# Patient Record
Sex: Male | Born: 1945
Health system: Southern US, Community
[De-identification: ages and names within clinical notes are randomized; demographics above are authoritative.]

## PROBLEM LIST (undated history)

## (undated) DIAGNOSIS — I700 Atherosclerosis of aorta: Secondary | ICD-10-CM

## (undated) DIAGNOSIS — Z87891 Personal history of nicotine dependence: Secondary | ICD-10-CM

## (undated) DIAGNOSIS — R319 Hematuria, unspecified: Secondary | ICD-10-CM

## (undated) DIAGNOSIS — K5790 Diverticulosis of intestine, part unspecified, without perforation or abscess without bleeding: Secondary | ICD-10-CM

## (undated) DIAGNOSIS — M1990 Unspecified osteoarthritis, unspecified site: Secondary | ICD-10-CM

## (undated) DIAGNOSIS — R058 Other specified cough: Secondary | ICD-10-CM

## (undated) DIAGNOSIS — H269 Unspecified cataract: Secondary | ICD-10-CM

## (undated) DIAGNOSIS — R05 Cough: Secondary | ICD-10-CM

## (undated) DIAGNOSIS — I10 Essential (primary) hypertension: Secondary | ICD-10-CM

## (undated) DIAGNOSIS — R011 Cardiac murmur, unspecified: Secondary | ICD-10-CM

## (undated) DIAGNOSIS — R51 Headache: Secondary | ICD-10-CM

## (undated) DIAGNOSIS — S129XXA Fracture of neck, unspecified, initial encounter: Secondary | ICD-10-CM

## (undated) DIAGNOSIS — F509 Eating disorder, unspecified: Secondary | ICD-10-CM

## (undated) DIAGNOSIS — E785 Hyperlipidemia, unspecified: Secondary | ICD-10-CM

## (undated) DIAGNOSIS — D126 Benign neoplasm of colon, unspecified: Secondary | ICD-10-CM

## (undated) DIAGNOSIS — K760 Fatty (change of) liver, not elsewhere classified: Secondary | ICD-10-CM

## (undated) HISTORY — PX: OTHER SURGICAL HISTORY: SHX169

## (undated) HISTORY — DX: Eating disorder, unspecified: F50.9

## (undated) HISTORY — PX: COLONOSCOPY: SHX174

## (undated) HISTORY — DX: Essential (primary) hypertension: I10

## (undated) HISTORY — DX: Unspecified osteoarthritis, unspecified site: M19.90

## (undated) HISTORY — DX: Cardiac murmur, unspecified: R01.1

## (undated) HISTORY — DX: Personal history of nicotine dependence: Z87.891

## (undated) HISTORY — DX: Headache: R51

## (undated) HISTORY — DX: Fatty (change of) liver, not elsewhere classified: K76.0

## (undated) HISTORY — DX: Fracture of neck, unspecified, initial encounter: S12.9XXA

## (undated) HISTORY — DX: Other specified cough: R05.8

## (undated) HISTORY — DX: Hyperlipidemia, unspecified: E78.5

## (undated) HISTORY — DX: Atherosclerosis of aorta: I70.0

## (undated) HISTORY — DX: Hematuria, unspecified: R31.9

## (undated) HISTORY — DX: Diverticulosis of intestine, part unspecified, without perforation or abscess without bleeding: K57.90

## (undated) HISTORY — DX: Cough: R05

## (undated) HISTORY — DX: Unspecified cataract: H26.9

## (undated) HISTORY — DX: Benign neoplasm of colon, unspecified: D12.6

---

## 2000-06-21 ENCOUNTER — Emergency Department (HOSPITAL_COMMUNITY): Admission: EM | Admit: 2000-06-21 | Discharge: 2000-06-21 | Payer: Self-pay | Admitting: Emergency Medicine

## 2000-06-21 ENCOUNTER — Encounter: Payer: Self-pay | Admitting: Emergency Medicine

## 2002-05-26 ENCOUNTER — Encounter: Admission: RE | Admit: 2002-05-26 | Discharge: 2002-05-26 | Payer: Self-pay | Admitting: Family Medicine

## 2002-05-26 ENCOUNTER — Encounter: Payer: Self-pay | Admitting: Family Medicine

## 2011-12-24 LAB — HM DIABETES EYE EXAM

## 2012-05-29 ENCOUNTER — Encounter: Payer: Self-pay | Admitting: Internal Medicine

## 2012-05-29 ENCOUNTER — Ambulatory Visit (INDEPENDENT_AMBULATORY_CARE_PROVIDER_SITE_OTHER): Payer: BC Managed Care – PPO | Admitting: Internal Medicine

## 2012-05-29 VITALS — BP 140/90 | HR 90 | Temp 98.6°F | Ht 71.5 in | Wt 160.8 lb

## 2012-05-29 DIAGNOSIS — Z720 Tobacco use: Secondary | ICD-10-CM

## 2012-05-29 DIAGNOSIS — F17200 Nicotine dependence, unspecified, uncomplicated: Secondary | ICD-10-CM

## 2012-05-29 DIAGNOSIS — Z1211 Encounter for screening for malignant neoplasm of colon: Secondary | ICD-10-CM

## 2012-05-29 DIAGNOSIS — E119 Type 2 diabetes mellitus without complications: Secondary | ICD-10-CM

## 2012-05-29 DIAGNOSIS — M778 Other enthesopathies, not elsewhere classified: Secondary | ICD-10-CM

## 2012-05-29 DIAGNOSIS — E785 Hyperlipidemia, unspecified: Secondary | ICD-10-CM

## 2012-05-29 DIAGNOSIS — I10 Essential (primary) hypertension: Secondary | ICD-10-CM | POA: Insufficient documentation

## 2012-05-29 MED ORDER — ATORVASTATIN CALCIUM 20 MG PO TABS: 20.0000 mg | ORAL_TABLET | Freq: Every day | ORAL | Status: DC

## 2012-05-29 MED ORDER — AMLODIPINE BESYLATE 5 MG PO TABS: 5.0000 mg | ORAL_TABLET | Freq: Every day | ORAL | Status: DC

## 2012-05-29 MED ORDER — LISINOPRIL-HYDROCHLOROTHIAZIDE 20-25 MG PO TABS: 1.0000 | ORAL_TABLET | Freq: Every day | ORAL | Status: DC

## 2012-05-29 NOTE — Assessment & Plan Note (Signed)
Blood pressure slightly above goal today. Will continue to monitor. We'll continue current medications. Recent renal function from March 2013 was normal. We'll plan to repeat in one month. Patient will followup in one month for recheck. If blood pressure consistently elevated, would favor increasing Norvasc to 10 mg daily.

## 2012-05-29 NOTE — Patient Instructions (Signed)
Please have labs checked at [facility] in 1 month.

## 2012-05-29 NOTE — Assessment & Plan Note (Signed)
Encouraged smoking cessation 

## 2012-05-29 NOTE — Assessment & Plan Note (Signed)
Patient with history of borderline diabetes. Last hemoglobin A1c was 6% performed at outside facility. He does not regularly check blood sugars. Will recheck hemoglobin A1c with labs in 1 month. Encourage continued efforts at healthy diet and regular physical activity.

## 2012-05-29 NOTE — Assessment & Plan Note (Signed)
Patient with left arm pain at the insertion site of the triceps. Suspect this is related to overuse with his job. Alternative consideration would be atypical angina, however no other symptoms to suggest this and EKG is normal today. We'll continue to monitor. Encouraged limiting to use as much is possible. Will use nonsteroidals and ice as needed. If symptoms are persistent, may need referral to sports medicine.

## 2012-05-29 NOTE — Assessment & Plan Note (Signed)
Patient has never had colon cancer screening. His sister had colon cancer and is deceased. Will set up colonoscopy.

## 2012-05-29 NOTE — Progress Notes (Signed)
Subjective:    Patient ID: Michael Roy, male    DOB: 1946/12/04, 66 y.o.   MRN: [id number]  HPI 66 year old male with history of prediabetes, hypertension, hyperlipidemia, and tobacco abuse presents to establish care. He reports that he is generally feeling well. In regards to prediabetes, this has been diet controlled. Labwork from March 2013 shows hemoglobin A1c of 6%. He does not regularly check his blood sugar.  In regards to history of hypertension, he reports full compliance with his medications. He denies any chest pain, palpitations, headache. He does not regularly check his blood pressure.  In regards to hyperlipidemia, recent labs show LDL greater than 100. He has never been on a statin medication. He has tried to limit intake of saturated fats and increase fiber with some improvement in his lipids. However, he does note some dietary indiscretion.  He notes that he has never had colon cancer screening. His sister died of colon cancer. In the past, he has refused colonoscopy. On occasion, he has noted dark stools, but none recently.  He also notes some intermittent discomfort in his left upper arm. 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 He has not taken any medication for this. He denies chest pain, shortness of breath, diaphoresis, nausea.  Outpatient Encounter Prescriptions as of 05/29/2012  Medication Sig Dispense Refill  . amLODipine (NORVASC) 5 MG tablet Take 1 tablet (5 mg total) by mouth daily.  90 tablet  4  . Aspirin-Salicylamide-Caffeine (BC HEADACHE POWDER PO) Take 1 packet by mouth as needed.      Marland Kitchen atorvastatin (LIPITOR) 20 MG tablet Take 1 tablet (20 mg total) by mouth daily.  30 tablet  3  . Cholecalciferol (VITAMIN D3) 400 UNITS CAPS Take 1 capsule by mouth daily.      Marland Kitchen lisinopril-hydrochlorothiazide (PRINZIDE,ZESTORETIC) 20-25 MG per tablet Take 1 tablet by mouth daily.  90 tablet  4  . meloxicam  (MOBIC) 15 MG tablet Take 15 mg by mouth daily.        Review of Systems  Constitutional: Negative for fever, chills, activity change, appetite change, fatigue and unexpected weight change.  Eyes: Negative for visual disturbance.  Respiratory: Negative for cough and shortness of breath.   Cardiovascular: Negative for chest pain, palpitations and leg swelling.  Gastrointestinal: Negative for abdominal pain and abdominal distention.  Genitourinary: Negative for dysuria, urgency and difficulty urinating.  Musculoskeletal: Positive for myalgias. Negative for arthralgias and gait problem.  Skin: Negative for color change and rash.  Hematological: Negative for adenopathy.  Psychiatric/Behavioral: Negative for disturbed wake/sleep cycle and dysphoric mood. The patient is not nervous/anxious.    BP 140/90  Pulse 90  Temp 98.6 F (37 C) (Oral)  Ht 5' 11.5" (1.816 m)  Wt 160 lb 12 oz (72.916 kg)  BMI 22.11 kg/m2  SpO2 97%     Objective:   Physical Exam  Constitutional: He is oriented to person, place, and time. He appears well-developed and well-nourished. No distress.  HENT:  Head: Normocephalic and atraumatic.  Right Ear: External ear normal.  Left Ear: External ear normal.  Nose: Nose normal.  Mouth/Throat: Oropharynx is clear and moist. No oropharyngeal exudate.  Eyes: Conjunctivae and EOM are normal. Pupils are equal, round, and reactive to light. Right eye exhibits no discharge. Left eye exhibits no discharge. No scleral icterus.  Neck: Normal range of motion. Neck supple. No tracheal deviation present. No thyromegaly present.  Cardiovascular: Normal rate,  regular rhythm and normal heart sounds.  Exam reveals no gallop and no friction rub.   No murmur heard. Pulmonary/Chest: Effort normal and breath sounds normal. No respiratory distress. He has no wheezes. He has no rales. He exhibits no tenderness.  Abdominal: Soft. Bowel sounds are normal. He exhibits no distension and no mass.  There is no tenderness.  Musculoskeletal: Normal range of motion. He exhibits no edema.  Lymphadenopathy:    He has no cervical adenopathy.  Neurological: He is alert and oriented to person, place, and time. No cranial nerve deficit. Coordination normal.  Skin: Skin is warm and dry. No rash noted. He is not diaphoretic. No erythema. No pallor.  Psychiatric: He has a normal mood and affect. His behavior is normal. Judgment and thought content normal.          Assessment & Plan:

## 2012-05-29 NOTE — Assessment & Plan Note (Signed)
Recent LDL on labs performed at outside facility was greater than 100. Discussed goal of less than 70 in the setting of prediabetes. Will start atorvastatin 20 mg daily. We'll recheck LFTs and lipids in one month.

## 2012-07-03 ENCOUNTER — Encounter: Payer: Self-pay | Admitting: Internal Medicine

## 2012-07-03 ENCOUNTER — Ambulatory Visit (INDEPENDENT_AMBULATORY_CARE_PROVIDER_SITE_OTHER): Payer: BC Managed Care – PPO | Admitting: Internal Medicine

## 2012-07-03 VITALS — BP 120/82 | HR 63 | Temp 98.4°F | Ht 71.5 in | Wt 162.0 lb

## 2012-07-03 DIAGNOSIS — I10 Essential (primary) hypertension: Secondary | ICD-10-CM

## 2012-07-03 DIAGNOSIS — E119 Type 2 diabetes mellitus without complications: Secondary | ICD-10-CM

## 2012-07-03 DIAGNOSIS — E785 Hyperlipidemia, unspecified: Secondary | ICD-10-CM

## 2012-07-03 MED ORDER — ATORVASTATIN CALCIUM 20 MG PO TABS: 20.0000 mg | ORAL_TABLET | Freq: Every day | ORAL | Status: DC

## 2012-07-03 NOTE — Progress Notes (Signed)
Subjective:    Patient ID: Michael Roy, male    DOB: [birth year], 66 y.o.   MRN: [id number]  HPI 66 year old male with history of hypertension, hyperlipidemia, and borderline diabetes presents for followup. At his last visit, we resumed Lipitor for treatment of hyperlipidemia. Repeat lipids show marked decrease in total cholesterol by 100 points. LDL is now less than 100. He has not made any changes in his diet. He reports that he has had no symptoms of myalgia with use of atorvastatin.  In regards to hypertension, he has not been checking his blood pressure regularly however reports full compliance with medication.  In regards to borderline diabetes, he also does not regularly check his blood sugar. Recent fasting blood sugar was 132.  He continues to have some pain and occasional numbness in his lateral left forearm and hand. However, he reports this is recently exacerbated by increased physical activity at work. He has not wish to pursue further testing or imaging at this point.  Outpatient Encounter Prescriptions as of 07/03/2012  Medication Sig Dispense Refill  . amLODipine (NORVASC) 5 MG tablet Take 1 tablet (5 mg total) by mouth daily.  90 tablet  4  . Aspirin-Salicylamide-Caffeine (BC HEADACHE POWDER PO) Take 1 packet by mouth as needed.      Marland Kitchen atorvastatin (LIPITOR) 20 MG tablet Take 1 tablet (20 mg total) by mouth daily.  30 tablet  6  . Cholecalciferol (VITAMIN D3) 400 UNITS CAPS Take 1 capsule by mouth daily.      Marland Kitchen lisinopril-hydrochlorothiazide (PRINZIDE,ZESTORETIC) 20-25 MG per tablet Take 1 tablet by mouth daily.  90 tablet  4  . meloxicam (MOBIC) 15 MG tablet Take 15 mg by mouth daily.      Marland Kitchen DISCONTD: atorvastatin (LIPITOR) 20 MG tablet Take 1 tablet (20 mg total) by mouth daily.  30 tablet  3    Review of Systems  Constitutional: Negative for fever, chills, activity change, appetite change, fatigue and unexpected weight change.  Eyes: Negative for visual disturbance.    Respiratory: Negative for cough and shortness of breath.   Cardiovascular: Negative for chest pain, palpitations and leg swelling.  Gastrointestinal: Negative for abdominal pain and abdominal distention.  Genitourinary: Negative for dysuria, urgency and difficulty urinating.  Musculoskeletal: Negative for arthralgias and gait problem.  Skin: Negative for color change and rash.  Neurological: Positive for numbness.  Hematological: Negative for adenopathy.  Psychiatric/Behavioral: Negative for disturbed wake/sleep cycle and dysphoric mood. The patient is not nervous/anxious.    BP 120/82  Pulse 63  Temp 98.4 F (36.9 C) (Oral)  Ht 5' 11.5" (1.816 m)  Wt 162 lb (73.483 kg)  BMI 22.28 kg/m2  SpO2 96%     Objective:   Physical Exam  Constitutional: He is oriented to person, place, and time. He appears well-developed and well-nourished. No distress.  HENT:  Head: Normocephalic and atraumatic.  Right Ear: External ear normal.  Left Ear: External ear normal.  Nose: Nose normal.  Mouth/Throat: Oropharynx is clear and moist. No oropharyngeal exudate.  Eyes: Conjunctivae and EOM are normal. Pupils are equal, round, and reactive to light. Right eye exhibits no discharge. Left eye exhibits no discharge. No scleral icterus.  Neck: Normal range of motion. Neck supple. No tracheal deviation present. No thyromegaly present.  Cardiovascular: Normal rate, regular rhythm and normal heart sounds.  Exam reveals no gallop and no friction rub.   No murmur heard. Pulmonary/Chest: Effort normal and breath sounds normal. No respiratory distress. He has no wheezes.  He has no rales. He exhibits no tenderness.  Musculoskeletal: Normal range of motion. He exhibits no edema.  Lymphadenopathy:    He has no cervical adenopathy.  Neurological: He is alert and oriented to person, place, and time. No cranial nerve deficit. Coordination normal.  Skin: Skin is warm and dry. No rash noted. He is not diaphoretic. No  erythema. No pallor.  Psychiatric: He has a normal mood and affect. His behavior is normal. Judgment and thought content normal.          Assessment & Plan:

## 2012-07-03 NOTE — Assessment & Plan Note (Signed)
Recent fasting blood sugar was 132. A1c was not checked with labs. Wrote orders to have A1c checked with labs at work. He is on an ACE inhibitor and statin. Followup in 3 months.

## 2012-07-03 NOTE — Assessment & Plan Note (Signed)
Blood pressure well-controlled today. We'll continue current medications. Recent renal function from July 2013 was normal. Followup in 3 months.

## 2012-07-03 NOTE — Assessment & Plan Note (Signed)
Recent LDL was improved at less than 100. Given borderline diabetes call or be less than 70. Encouraged improved compliance with diet with increased intake of fiber and decrease saturated fat. Will continue atorvastatin 20 mg daily and plan to repeat lipids in 3 months.

## 2012-07-04 HISTORY — PX: POLYPECTOMY: SHX149

## 2012-07-09 ENCOUNTER — Ambulatory Visit (AMBULATORY_SURGERY_CENTER): Payer: BC Managed Care – PPO

## 2012-07-09 ENCOUNTER — Encounter: Payer: Self-pay | Admitting: Internal Medicine

## 2012-07-09 VITALS — Ht 71.5 in | Wt 160.0 lb

## 2012-07-09 DIAGNOSIS — Z1211 Encounter for screening for malignant neoplasm of colon: Secondary | ICD-10-CM

## 2012-07-09 MED ORDER — MOVIPREP 100 G PO SOLR: 1.0000 | Freq: Once | ORAL | Status: DC

## 2012-07-10 ENCOUNTER — Telehealth: Payer: Self-pay | Admitting: Internal Medicine

## 2012-07-10 NOTE — Telephone Encounter (Signed)
Called patient at home number x 2, someone picks up the phone and hangs up.  Will call back later.

## 2012-07-10 NOTE — Telephone Encounter (Signed)
Labs show A1c of 6.1%. This is consistent with pre-diabetes. I would recommend decreased intake of processed carbohydrates.

## 2012-07-11 NOTE — Telephone Encounter (Signed)
Patient advised as instructed via telephone. 

## 2012-07-15 LAB — HM COLONOSCOPY: HM Colonoscopy: 18

## 2012-07-23 ENCOUNTER — Encounter: Payer: Self-pay | Admitting: Internal Medicine

## 2012-07-23 ENCOUNTER — Ambulatory Visit (AMBULATORY_SURGERY_CENTER): Payer: BC Managed Care – PPO | Admitting: Internal Medicine

## 2012-07-23 VITALS — BP 147/96 | HR 88 | Temp 96.4°F | Resp 18 | Ht 71.5 in | Wt 160.0 lb

## 2012-07-23 DIAGNOSIS — K5289 Other specified noninfective gastroenteritis and colitis: Secondary | ICD-10-CM

## 2012-07-23 DIAGNOSIS — Z800 Family history of malignant neoplasm of digestive organs: Secondary | ICD-10-CM

## 2012-07-23 DIAGNOSIS — K529 Noninfective gastroenteritis and colitis, unspecified: Secondary | ICD-10-CM

## 2012-07-23 DIAGNOSIS — Z8371 Family history of colonic polyps: Secondary | ICD-10-CM

## 2012-07-23 DIAGNOSIS — D126 Benign neoplasm of colon, unspecified: Secondary | ICD-10-CM

## 2012-07-23 DIAGNOSIS — Z1211 Encounter for screening for malignant neoplasm of colon: Secondary | ICD-10-CM

## 2012-07-23 DIAGNOSIS — K635 Polyp of colon: Secondary | ICD-10-CM

## 2012-07-23 MED ORDER — SODIUM CHLORIDE 0.9 % IV SOLN: 500.0000 mL | INTRAVENOUS | Status: DC

## 2012-07-23 NOTE — Op Note (Signed)
[facility] [address] [place], [address]   COLONOSCOPY PROCEDURE REPORT  PATIENT: Prentiss, Polio  MR#: [id number] BIRTHDATE: 28-Jan-1946 , 66  yrs. old GENDER: Male ENDOSCOPIST: Beverley Fiedler, MD REFERRED [id number] Dan Humphreys, M.D. PROCEDURE DATE:  07/23/2012 PROCEDURE:   Colonoscopy with biopsy, Colonoscopy with snare polypectomy, and Colonoscopy with cold biopsy polypectomy ASA CLASS:   Class II INDICATIONS:elevated risk screening (sister with colon cancer and brother with colon polyps) and first colonoscopy. MEDICATIONS: propofol (Diprivan)  460 mg IV  DESCRIPTION OF PROCEDURE:   After the risks benefits and alternatives of the procedure were thoroughly explained, informed consent was obtained.       The LB [id number]  endoscope was introduced through the anus and advanced to the   . No adverse events experienced.   The quality of the prep was good, using MoviPrep  The instrument was then slowly withdrawn as the colon was fully examined.      COLON FINDINGS: Two sessile polyps measuring 3-6 mm in size were found at the cecum.  A polypectomy was performed with a cold snare. The resection was complete and the polyp tissue was completely retrieved.  A polypectomy was performed using snare cautery.  The resection was complete and the polyp tissue was completely retrieved.  Three polyps were identified and removed from the ascending colon, measuring 2 - 6 mm.  The polyps were removed with cold snare (2) and cold forceps (1).  Retrieval complete.  2 pedunculated polyps 5 - 8 mm were removed from the transverse colon with hot snare and retrieved.  3 polyps were removed from descending colon with cold snare and retrieved (4 - 6mm).  1 sessile 6 mm polyp was removed from sigmoid colon with hot snare and retrieved.  There was erythema scattered throughout the transverse and left colon and cold biopsies were taken and sent to pathology.  2 pedunculated  polyps measuring 15 - 20 mm were removed with hot snare from the sigmoid colon and retrieved.  5 additional polyps were removed from the rectosigmoid colon with hot and cold snare and retrieved.  These measured 3 - 8 mm.  Diverticulosis, mild, in the left colon.  Retroflexed views revealed internal hemorrhoid.  The time to cecum =     Withdrawal time=    and the procedure completed. COMPLICATIONS: There were no complications. ENDOSCOPIC IMPRESSION: 1. 18 polyps removed from cecum to rectum measuring 2 - 20 mm with cold forceps, cold and hot snare. and sent to pathology. 2. Diverticulosis left colon. 3. Internal hemorrhoids. 4) Scattered and nonspecific erythema, possible prep artifact. Multiple biopsies obtained.  RECOMMENDATIONS: 1.  Hold aspirin, aspirin products, and anti-inflammatory medication for 2 weeks. 2.  Await pathology results 3.  High fiber diet. 4.  Repeat Colonoscopy in 1 year. 5.  You will receive a letter within 1-2 weeks with the results of your biopsy as well as final recommendations.  Please call my office if you have not received a letter after 3 weeks.  eSigned:  Beverley Fiedler, MD 07/23/2012 10:45 AM   OZ:[id number], Victorino Dike MD The Patient   PATIENT NAME:  Abb, Gobert MR#: [id number]

## 2012-07-23 NOTE — Patient Instructions (Addendum)
Discharge instructions given with verbal understanding. Handouts on polyps, diverticulosis and hemorrhoids given. Resume previous medications. Hold aspirin and aspirin products for two weeks. YOU HAD AN ENDOSCOPIC PROCEDURE TODAY AT THE [facility]: Refer to the procedure report that was given to you for any specific questions about what was found during the examination.  If the procedure report does not answer your questions, please call your gastroenterologist to clarify.  If you requested that your care partner not be given the details of your procedure findings, then the procedure report has been included in a sealed envelope for you to review at your convenience later.  YOU SHOULD EXPECT: Some feelings of bloating in the abdomen. Passage of more gas than usual.  Walking can help get rid of the air that was put into your GI tract during the procedure and reduce the bloating. If you had a lower endoscopy (such as a colonoscopy or flexible sigmoidoscopy) you may notice spotting of blood in your stool or on the toilet paper. If you underwent a bowel prep for your procedure, then you may not have a normal bowel movement for a few days.  DIET: Your first meal following the procedure should be a light meal and then it is ok to progress to your normal diet.  A half-sandwich or bowl of soup is an example of a good first meal.  Heavy or fried foods are harder to digest and may make you feel nauseous or bloated.  Likewise meals heavy in dairy and vegetables can cause extra gas to form and this can also increase the bloating.  Drink plenty of fluids but you should avoid alcoholic beverages for 24 hours.  ACTIVITY: Your care partner should take you home directly after the procedure.  You should plan to take it easy, moving slowly for the rest of the day.  You can resume normal activity the day after the procedure however you should NOT DRIVE or use heavy machinery for 24 hours (because of the sedation  medicines used during the test).    SYMPTOMS TO REPORT IMMEDIATELY: A gastroenterologist can be reached at any hour.  During normal business hours, 8:30 AM to 5:00 PM Monday through Friday, call [phone].  After hours and on weekends, please call the GI answering service at [phone] who will take a message and have the physician on call contact you.   Following lower endoscopy (colonoscopy or flexible sigmoidoscopy):  Excessive amounts of blood in the stool  Significant tenderness or worsening of abdominal pains  Swelling of the abdomen that is new, acute  Fever of 100F or higher FOLLOW UP: If any biopsies were taken you will be contacted by phone or by letter within the next 1-3 weeks.  Call your gastroenterologist if you have not heard about the biopsies in 3 weeks.  Our staff will call the home number listed on your records the next business day following your procedure to check on you and address any questions or concerns that you may have at that time regarding the information given to you following your procedure. This is a courtesy call and so if there is no answer at the home number and we have not heard from you through the emergency physician on call, we will assume that you have returned to your regular daily activities without incident.  SIGNATURES/CONFIDENTIALITY: You and/or your care partner have signed paperwork which will be entered into your electronic medical record.  These signatures attest to the fact that  that the information above on your After Visit Summary has been reviewed and is understood.  Full responsibility of the confidentiality of this discharge information lies with you and/or your care-partner.

## 2012-07-23 NOTE — Progress Notes (Signed)
Patient did not experience any of the following events: a burn prior to discharge; a fall within the facility; wrong site/side/patient/procedure/implant event; or a hospital transfer or hospital admission upon discharge from the facility. (G8907) Patient did not have preoperative order for IV antibiotic SSI prophylaxis. (G8918)  

## 2012-07-24 ENCOUNTER — Telehealth: Payer: Self-pay | Admitting: *Deleted

## 2012-07-24 NOTE — Telephone Encounter (Signed)
  Follow up Call-  Call back number 07/23/2012  Post procedure Call Back phone  # [phone]  Permission to leave phone message Yes     Patient questions:  Do you have a fever, pain , or abdominal swelling? no Pain Score  0 *  Have you tolerated food without any problems? yes  Have you been able to return to your normal activities? yes  Do you have any questions about your discharge instructions: Diet   no Medications  no Follow up visit  no  Do you have questions or concerns about your Care? no  Actions: * If pain score is 4 or above: No action needed, pain <4.

## 2012-07-29 ENCOUNTER — Encounter: Payer: Self-pay | Admitting: Internal Medicine

## 2012-10-09 ENCOUNTER — Encounter: Payer: Self-pay | Admitting: Internal Medicine

## 2012-10-09 ENCOUNTER — Ambulatory Visit (INDEPENDENT_AMBULATORY_CARE_PROVIDER_SITE_OTHER): Payer: BC Managed Care – PPO | Admitting: Internal Medicine

## 2012-10-09 VITALS — BP 122/72 | HR 80 | Temp 98.8°F | Ht 71.5 in | Wt 163.5 lb

## 2012-10-09 DIAGNOSIS — R200 Anesthesia of skin: Secondary | ICD-10-CM | POA: Insufficient documentation

## 2012-10-09 DIAGNOSIS — R209 Unspecified disturbances of skin sensation: Secondary | ICD-10-CM

## 2012-10-09 DIAGNOSIS — E119 Type 2 diabetes mellitus without complications: Secondary | ICD-10-CM

## 2012-10-09 DIAGNOSIS — I10 Essential (primary) hypertension: Secondary | ICD-10-CM

## 2012-10-09 NOTE — Patient Instructions (Signed)
Please check CMP, lipids, A1c with labs prior to next visit (Labs done at [facility]).

## 2012-10-09 NOTE — Progress Notes (Signed)
Subjective:    Patient ID: Michael Roy, male    DOB: 1946-04-18, 66 y.o.   MRN: [id number]  HPI 66 year old male with history of hypertension, borderline diabetes, hyperlipidemia presents for followup. He reports he is generally feeling well. He reports full compliance with all his medications. He did not have repeat lab work checked at his employer prior to this visit. Last hemoglobin A1c was 6.1%. Next  He is concerned today about several month history of weakness in his left hand and numbness in his left hand. He reports that in the distant past he had a fracture of the cervical spine, a hangman's fracture. Since that time he has had intermittent episodes of numbness and weakness in his left hand. He reports he occasionally drops things from his hands. He is not currently taking any medication for the symptoms.  Outpatient Encounter Prescriptions as of 10/09/2012  Medication Sig Dispense Refill  . amLODipine (NORVASC) 5 MG tablet Take 1 tablet (5 mg total) by mouth daily.  90 tablet  4  . Aspirin-Salicylamide-Caffeine (BC HEADACHE POWDER PO) Take 1 packet by mouth as needed.      Marland Kitchen atorvastatin (LIPITOR) 20 MG tablet Take 1 tablet (20 mg total) by mouth daily.  30 tablet  6  . Cholecalciferol (VITAMIN D3) 400 UNITS CAPS Take 1 capsule by mouth daily.      Marland Kitchen lisinopril-hydrochlorothiazide (PRINZIDE,ZESTORETIC) 20-25 MG per tablet Take 1 tablet by mouth daily.  90 tablet  4  . meloxicam (MOBIC) 15 MG tablet Take 15 mg by mouth daily.       BP 122/72  Pulse 80  Temp 98.8 F (37.1 C) (Oral)  Ht 5' 11.5" (1.816 m)  Wt 163 lb 8 oz (74.163 kg)  BMI 22.49 kg/m2  SpO2 96%  Review of Systems  Constitutional: Negative for fever, chills, activity change, appetite change, fatigue and unexpected weight change.  Eyes: Negative for visual disturbance.  Respiratory: Negative for cough and shortness of breath.   Cardiovascular: Negative for chest pain, palpitations and leg swelling.    Gastrointestinal: Negative for abdominal pain and abdominal distention.  Genitourinary: Negative for dysuria, urgency and difficulty urinating.  Musculoskeletal: Negative for arthralgias and gait problem.  Skin: Negative for color change and rash.  Neurological: Positive for weakness and numbness.  Hematological: Negative for adenopathy.  Psychiatric/Behavioral: Negative for sleep disturbance and dysphoric mood. The patient is not nervous/anxious.        Objective:   Physical Exam  Constitutional: He is oriented to person, place, and time. He appears well-developed and well-nourished. No distress.  HENT:  Head: Normocephalic and atraumatic.  Right Ear: External ear normal.  Left Ear: External ear normal.  Nose: Nose normal.  Mouth/Throat: Oropharynx is clear and moist. No oropharyngeal exudate.  Eyes: Conjunctivae normal and EOM are normal. Pupils are equal, round, and reactive to light. Right eye exhibits no discharge. Left eye exhibits no discharge. No scleral icterus.  Neck: Normal range of motion. Neck supple. No tracheal deviation present. No thyromegaly present.  Cardiovascular: Normal rate, regular rhythm and normal heart sounds.  Exam reveals no gallop and no friction rub.   No murmur heard. Pulmonary/Chest: Effort normal and breath sounds normal. No respiratory distress. He has no wheezes. He has no rales. He exhibits no tenderness.  Musculoskeletal: Normal range of motion. He exhibits no edema.  Lymphadenopathy:    He has no cervical adenopathy.  Neurological: He is alert and oriented to person, place, and time. He has normal strength.  No cranial nerve deficit or sensory deficit. Coordination normal.  Skin: Skin is warm and dry. No rash noted. He is not diaphoretic. No erythema. No pallor.  Psychiatric: He has a normal mood and affect. His behavior is normal. Judgment and thought content normal.          Assessment & Plan:

## 2012-10-09 NOTE — Assessment & Plan Note (Signed)
Recent A1c was well controlled at 6.1%. Encouraged continued efforts at healthy diet and regular physical activity. Will plan to recheck A1c in 3 months.

## 2012-10-09 NOTE — Assessment & Plan Note (Signed)
Blood pressure well-controlled on current medications. Will continue. Will check renal function with labs in 3 months.

## 2012-10-09 NOTE — Assessment & Plan Note (Signed)
Symptoms are most concerning for cervical radiculopathy. Will get MRI cervical spine for further evaluation.

## 2012-10-16 ENCOUNTER — Telehealth: Payer: Self-pay | Admitting: Internal Medicine

## 2012-10-16 ENCOUNTER — Encounter: Payer: Self-pay | Admitting: *Deleted

## 2012-10-16 NOTE — Telephone Encounter (Signed)
Letter mailed advising patient as instructed. 

## 2012-10-16 NOTE — Telephone Encounter (Signed)
Labs were stable. A1c 6%. LDL cholesterol 100. Kidney, liver function normal. No changes to current meds.

## 2012-10-25 ENCOUNTER — Telehealth: Payer: Self-pay | Admitting: Internal Medicine

## 2012-10-25 NOTE — Telephone Encounter (Signed)
MRI of the cervical spine shows multilevel degenerative disc disease with canal narrowing particularly at level C3-4. Would recommend referral to neurosurgery for evaluation.

## 2012-10-25 NOTE — Telephone Encounter (Signed)
Pt phone busy will call back later.

## 2012-10-30 ENCOUNTER — Telehealth: Payer: Self-pay | Admitting: General Practice

## 2012-10-30 NOTE — Telephone Encounter (Signed)
Pt.notified

## 2012-10-30 NOTE — Telephone Encounter (Signed)
We only have a copy of the report. He can request the CD from radiology.

## 2012-10-30 NOTE — Telephone Encounter (Signed)
Pt called stating that he wants a copy of there MRI. Do we have this?

## 2012-10-30 NOTE — Telephone Encounter (Signed)
Pt finally notified. Stated that he would have to think about the referral.

## 2012-11-04 ENCOUNTER — Encounter: Payer: Self-pay | Admitting: Internal Medicine

## 2013-01-08 ENCOUNTER — Ambulatory Visit: Payer: BC Managed Care – PPO | Admitting: Internal Medicine

## 2013-01-21 ENCOUNTER — Telehealth: Payer: Self-pay | Admitting: Internal Medicine

## 2013-01-21 NOTE — Telephone Encounter (Signed)
Request for medical claim information form in box

## 2013-01-28 NOTE — Telephone Encounter (Signed)
LMTCB Paperwork is for him, the patient to fill out not his physician.

## 2013-01-29 NOTE — Telephone Encounter (Signed)
Paperwork picked up.

## 2013-02-12 ENCOUNTER — Ambulatory Visit (INDEPENDENT_AMBULATORY_CARE_PROVIDER_SITE_OTHER): Payer: BC Managed Care – PPO | Admitting: Internal Medicine

## 2013-02-12 ENCOUNTER — Encounter: Payer: Self-pay | Admitting: Internal Medicine

## 2013-02-12 VITALS — BP 122/80 | HR 84 | Temp 98.9°F | Wt 164.0 lb

## 2013-02-12 DIAGNOSIS — Z720 Tobacco use: Secondary | ICD-10-CM

## 2013-02-12 DIAGNOSIS — I10 Essential (primary) hypertension: Secondary | ICD-10-CM

## 2013-02-12 DIAGNOSIS — R209 Unspecified disturbances of skin sensation: Secondary | ICD-10-CM

## 2013-02-12 DIAGNOSIS — E785 Hyperlipidemia, unspecified: Secondary | ICD-10-CM

## 2013-02-12 DIAGNOSIS — F17200 Nicotine dependence, unspecified, uncomplicated: Secondary | ICD-10-CM

## 2013-02-12 DIAGNOSIS — R202 Paresthesia of skin: Secondary | ICD-10-CM

## 2013-02-12 NOTE — Assessment & Plan Note (Signed)
Encouraged smoking cessation 

## 2013-02-12 NOTE — Assessment & Plan Note (Signed)
Symptoms stable. Pt has opted not to pursue surgical intervention of DJD in cervical spine at this time.

## 2013-02-12 NOTE — Assessment & Plan Note (Signed)
BP Readings from Last 3 Encounters:  02/12/13 122/80  10/09/12 122/72  07/23/12 147/96   BP well controlled on current medications. Will order renal function and urine microalbumin to be drawn through pt work.

## 2013-02-12 NOTE — Progress Notes (Signed)
Subjective:    Patient ID: Michael Roy, male    DOB: May 02, 1946, 67 y.o.   MRN: [id number]  HPI 67 year old male with history of hypertension, hyperlipidemia, tobacco abuse, borderline elevated blood sugars presents for followup. He reports he is generally feeling well. He is compliant with medications. He denies any chest pain, shortness of breath, headache, palpitation. He is physically active in his job. He continues to smoke. He continues to have left arm numbness which has been chronic for him. He was evaluated by neurosurgery and found to have bulging disc in cervical spine. He reports he has opted not to pursue surgical intervention at this time. He denies any new concerns today.  Outpatient Encounter Prescriptions as of 02/12/2013  Medication Sig Dispense Refill  . amLODipine (NORVASC) 5 MG tablet Take 1 tablet (5 mg total) by mouth daily.  90 tablet  4  . Aspirin-Salicylamide-Caffeine (BC HEADACHE POWDER PO) Take 1 packet by mouth as needed.      Marland Kitchen atorvastatin (LIPITOR) 20 MG tablet Take 1 tablet (20 mg total) by mouth daily.  30 tablet  6  . Cholecalciferol (VITAMIN D3) 400 UNITS CAPS Take 1 capsule by mouth daily.      Marland Kitchen lisinopril-hydrochlorothiazide (PRINZIDE,ZESTORETIC) 20-25 MG per tablet Take 1 tablet by mouth daily.  90 tablet  4  . meloxicam (MOBIC) 15 MG tablet Take 15 mg by mouth daily.       No facility-administered encounter medications on file as of 02/12/2013.   BP 122/80  Pulse 84  Temp(Src) 98.9 F (37.2 C) (Oral)  Wt 164 lb (74.39 kg)  BMI 22.56 kg/m2  SpO2 92%  Review of Systems  Constitutional: Negative for fever, chills, activity change, appetite change, fatigue and unexpected weight change.  Eyes: Negative for visual disturbance.  Respiratory: Negative for cough and shortness of breath.   Cardiovascular: Negative for chest pain, palpitations and leg swelling.  Gastrointestinal: Negative for abdominal pain and abdominal distention.  Genitourinary:  Negative for dysuria, urgency and difficulty urinating.  Musculoskeletal: Negative for arthralgias and gait problem.  Skin: Negative for color change and rash.  Neurological: Positive for numbness.  Hematological: Negative for adenopathy.  Psychiatric/Behavioral: Negative for sleep disturbance and dysphoric mood. The patient is not nervous/anxious.        Objective:   Physical Exam  Constitutional: He is oriented to person, place, and time. He appears well-developed and well-nourished. No distress.  HENT:  Head: Normocephalic and atraumatic.  Right Ear: External ear normal.  Left Ear: External ear normal.  Nose: Nose normal.  Mouth/Throat: Oropharynx is clear and moist. No oropharyngeal exudate.  Eyes: Conjunctivae and EOM are normal. Pupils are equal, round, and reactive to light. Right eye exhibits no discharge. Left eye exhibits no discharge. No scleral icterus.  Neck: Normal range of motion. Neck supple. No tracheal deviation present. No thyromegaly present.  Cardiovascular: Normal rate, regular rhythm and normal heart sounds.  Exam reveals no gallop and no friction rub.   No murmur heard. Pulmonary/Chest: Effort normal and breath sounds normal. No respiratory distress. He has no wheezes. He has no rales. He exhibits no tenderness.  Musculoskeletal: Normal range of motion. He exhibits no edema.  Lymphadenopathy:    He has no cervical adenopathy.  Neurological: He is alert and oriented to person, place, and time. No cranial nerve deficit. Coordination normal.  Skin: Skin is warm and dry. No rash noted. He is not diaphoretic. No erythema. No pallor.  Psychiatric: He has a normal mood and  affect. His behavior is normal. Judgment and thought content normal.          Assessment & Plan:

## 2013-02-12 NOTE — Assessment & Plan Note (Signed)
Lipids previously well controlled. Will continue Atorvastatin.

## 2013-02-20 ENCOUNTER — Telehealth: Payer: Self-pay | Admitting: Internal Medicine

## 2013-02-20 NOTE — Telephone Encounter (Signed)
Labs from 02/17/2013 were stable with A1c of 6.1%. Urinalysis was positive for blood. Has patient had a repeat urinalysis to followup?

## 2013-02-20 NOTE — Telephone Encounter (Signed)
LMTCB

## 2013-02-21 NOTE — Telephone Encounter (Signed)
LMTCB

## 2013-02-21 NOTE — Telephone Encounter (Signed)
Patient informed and will come in for repeat UA

## 2013-02-26 ENCOUNTER — Other Ambulatory Visit: Payer: Self-pay | Admitting: *Deleted

## 2013-02-26 ENCOUNTER — Telehealth: Payer: Self-pay | Admitting: Internal Medicine

## 2013-02-26 ENCOUNTER — Other Ambulatory Visit (HOSPITAL_COMMUNITY)
Admission: RE | Admit: 2013-02-26 | Discharge: 2013-02-26 | Disposition: A | Discharge status: Home / Self Care | Payer: BC Managed Care – PPO | Source: Ambulatory Visit | Attending: Internal Medicine | Admitting: Internal Medicine

## 2013-02-26 ENCOUNTER — Other Ambulatory Visit (INDEPENDENT_AMBULATORY_CARE_PROVIDER_SITE_OTHER): Payer: BC Managed Care – PPO | Admitting: *Deleted

## 2013-02-26 DIAGNOSIS — N390 Urinary tract infection, site not specified: Secondary | ICD-10-CM

## 2013-02-26 DIAGNOSIS — R319 Hematuria, unspecified: Secondary | ICD-10-CM | POA: Insufficient documentation

## 2013-02-26 LAB — POCT URINALYSIS DIPSTICK
Nitrite, UA: NEGATIVE
Urobilinogen, UA: 0.2
pH, UA: 5

## 2013-02-26 NOTE — Telephone Encounter (Signed)
Pt states he was told by Dr. Tilman Neat nurse last week to drop off a urine here at the clinic.  Pt states his wife will be here around 2:30 to drop that off.

## 2013-02-26 NOTE — Telephone Encounter (Signed)
Spoke with patient to make him aware urine must be in a sterile cup and he stated it was. Wife will be dropping it off for him today.

## 2013-02-28 LAB — URINE CULTURE
Colony Count: NO GROWTH
Organism ID, Bacteria: NO GROWTH

## 2013-03-10 ENCOUNTER — Telehealth: Payer: Self-pay | Admitting: Internal Medicine

## 2013-03-10 NOTE — Telephone Encounter (Signed)
Patient was informed of lab results but at this time he would not like to be referred to an urologist. He stated he just want to hold off on that, has not had any symptoms of a stone or anything. States he feel fine.

## 2013-03-10 NOTE — Telephone Encounter (Signed)
Patient called wanting lab results. 

## 2013-03-10 NOTE — Telephone Encounter (Signed)
Left message to call back  

## 2013-03-12 ENCOUNTER — Encounter: Payer: Self-pay | Admitting: Internal Medicine

## 2013-05-05 ENCOUNTER — Encounter: Payer: Self-pay | Admitting: Internal Medicine

## 2013-06-02 ENCOUNTER — Other Ambulatory Visit: Payer: Self-pay | Admitting: *Deleted

## 2013-06-02 DIAGNOSIS — E785 Hyperlipidemia, unspecified: Secondary | ICD-10-CM

## 2013-06-02 MED ORDER — ATORVASTATIN CALCIUM 20 MG PO TABS: 20.0000 mg | ORAL_TABLET | Freq: Every day | ORAL | Status: DC

## 2013-06-02 NOTE — Telephone Encounter (Signed)
Eprescribed.

## 2013-06-20 ENCOUNTER — Telehealth: Payer: Self-pay | Admitting: Internal Medicine

## 2013-06-20 DIAGNOSIS — I10 Essential (primary) hypertension: Secondary | ICD-10-CM

## 2013-06-20 MED ORDER — LISINOPRIL-HYDROCHLOROTHIAZIDE 20-25 MG PO TABS: 1.0000 | ORAL_TABLET | Freq: Every day | ORAL | Status: DC

## 2013-06-20 MED ORDER — AMLODIPINE BESYLATE 5 MG PO TABS: 5.0000 mg | ORAL_TABLET | Freq: Every day | ORAL | Status: DC

## 2013-06-20 NOTE — Telephone Encounter (Signed)
Pt is needing refill on Amlodipine Besylate tablets 5 mg, Atorvastatin 20 mg tablets, and Lisinopril/HCTZ20-25 tablets.He uses [facility] on [address]. Pt is completely out of all meds.

## 2013-06-20 NOTE — Telephone Encounter (Signed)
Pt's wife says he has been out for atleast a month of these meds and she does apologize for the short notice she had no idea he has not been taking his meds.

## 2013-06-20 NOTE — Telephone Encounter (Signed)
Rx sent to pharmacy and patient informed for future references please contact his pharmacy and they will send [place] the refill request.

## 2013-07-18 ENCOUNTER — Encounter: Payer: Self-pay | Admitting: Internal Medicine

## 2013-08-19 ENCOUNTER — Ambulatory Visit (INDEPENDENT_AMBULATORY_CARE_PROVIDER_SITE_OTHER): Payer: BC Managed Care – PPO | Admitting: Internal Medicine

## 2013-08-19 ENCOUNTER — Encounter: Payer: Self-pay | Admitting: Internal Medicine

## 2013-08-19 VITALS — BP 118/84 | HR 70 | Temp 98.3°F | Ht 69.5 in | Wt 160.0 lb

## 2013-08-19 DIAGNOSIS — F17200 Nicotine dependence, unspecified, uncomplicated: Secondary | ICD-10-CM

## 2013-08-19 DIAGNOSIS — Z720 Tobacco use: Secondary | ICD-10-CM

## 2013-08-19 DIAGNOSIS — R319 Hematuria, unspecified: Secondary | ICD-10-CM

## 2013-08-19 DIAGNOSIS — R7309 Other abnormal glucose: Secondary | ICD-10-CM

## 2013-08-19 DIAGNOSIS — R739 Hyperglycemia, unspecified: Secondary | ICD-10-CM

## 2013-08-19 DIAGNOSIS — Z125 Encounter for screening for malignant neoplasm of prostate: Secondary | ICD-10-CM

## 2013-08-19 DIAGNOSIS — E785 Hyperlipidemia, unspecified: Secondary | ICD-10-CM

## 2013-08-19 DIAGNOSIS — I10 Essential (primary) hypertension: Secondary | ICD-10-CM

## 2013-08-19 HISTORY — DX: Hematuria, unspecified: R31.9

## 2013-08-19 NOTE — Progress Notes (Signed)
Subjective:    Patient ID: Michael Roy, male    DOB: 1946-07-18, 67 y.o.   MRN: [id number]  HPI 67 year old male with history of hypertension, hyperlipidemia, elevated blood sugars, and tobacco abuse presents for followup. He reports he is generally feeling well. Only concern today was mild irritation of his right eye earlier this week. He reports that his eye felt dry and itchy for a day or so. This has resolved. He denies visual changes, drainage from the site, eye pain. He has not had any fever or chills.  He reports compliance with his medications. No recent chest pain, palpitations, headache. He continues to work full-time. He continues to smoke about 1PPD.  Outpatient Encounter Prescriptions as of 08/19/2013  Medication Sig Dispense Refill  . amLODipine (NORVASC) 5 MG tablet Take 1 tablet (5 mg total) by mouth daily.  90 tablet  0  . Aspirin-Salicylamide-Caffeine (BC HEADACHE POWDER PO) Take 1 packet by mouth as needed.      Marland Kitchen atorvastatin (LIPITOR) 20 MG tablet Take 1 tablet (20 mg total) by mouth daily.  30 tablet  2  . Cholecalciferol (VITAMIN D3) 400 UNITS CAPS Take 1 capsule by mouth daily.      Marland Kitchen lisinopril-hydrochlorothiazide (PRINZIDE,ZESTORETIC) 20-25 MG per tablet Take 1 tablet by mouth daily.  90 tablet  0  . meloxicam (MOBIC) 15 MG tablet Take 15 mg by mouth daily.       No facility-administered encounter medications on file as of 08/19/2013.   BP 118/84  Pulse 70  Temp(Src) 98.3 F (36.8 C) (Oral)  Ht 5' 9.5" (1.765 m)  Wt 160 lb (72.576 kg)  BMI 23.3 kg/m2  SpO2 95%  Review of Systems  Constitutional: Negative for fever, chills, activity change, appetite change, fatigue and unexpected weight change.  Eyes: Negative for visual disturbance.  Respiratory: Negative for cough and shortness of breath.   Cardiovascular: Negative for chest pain, palpitations and leg swelling.  Gastrointestinal: Negative for abdominal pain and abdominal distention.  Genitourinary:  Negative for dysuria, urgency and difficulty urinating.  Musculoskeletal: Negative for arthralgias and gait problem.  Skin: Negative for color change and rash.  Hematological: Negative for adenopathy.  Psychiatric/Behavioral: Negative for sleep disturbance and dysphoric mood. The patient is not nervous/anxious.        Objective:   Physical Exam  Constitutional: He is oriented to person, place, and time. He appears well-developed and well-nourished. No distress.  HENT:  Head: Normocephalic and atraumatic.  Right Ear: External ear normal.  Left Ear: External ear normal.  Nose: Nose normal.  Mouth/Throat: Oropharynx is clear and moist. No oropharyngeal exudate.  Eyes: Conjunctivae and EOM are normal. Pupils are equal, round, and reactive to light. Right eye exhibits no discharge. Left eye exhibits no discharge. No scleral icterus.  Neck: Normal range of motion. Neck supple. No tracheal deviation present. No thyromegaly present.  Cardiovascular: Normal rate, regular rhythm and normal heart sounds.  Exam reveals no gallop and no friction rub.   No murmur heard. Pulmonary/Chest: Effort normal and breath sounds normal. No respiratory distress. He has no wheezes. He has no rales. He exhibits no tenderness.  Abdominal: Soft. Bowel sounds are normal. He exhibits no distension and no mass. There is no tenderness. There is no rebound and no guarding.  Musculoskeletal: Normal range of motion. He exhibits no edema.  Lymphadenopathy:    He has no cervical adenopathy.  Neurological: He is alert and oriented to person, place, and time. No cranial nerve deficit. Coordination normal.  Skin: Skin is warm and dry. No rash noted. He is not diaphoretic. No erythema. No pallor.  Psychiatric: He has a normal mood and affect. His behavior is normal. Judgment and thought content normal.          Assessment & Plan:

## 2013-08-19 NOTE — Assessment & Plan Note (Signed)
BP Readings from Last 3 Encounters:  08/19/13 118/84  02/12/13 122/80  10/09/12 122/72   BP well controlled on current medications. Will check renal function with labs. Follow up 6 months and prn.

## 2013-08-19 NOTE — Assessment & Plan Note (Signed)
Discussed potential benefits and limitations of PSA testing. Will check PSA with labs today.

## 2013-08-19 NOTE — Assessment & Plan Note (Signed)
History of elevated blood sugar in the past with A1c of 6.1%. Will repeat A1c with labs today.

## 2013-08-19 NOTE — Assessment & Plan Note (Signed)
Strongly encouraged smoking cessation. Discussed options including Nicotine replacement, Chantix and Wellbutrin. Also recommended yearly Chest CT to screen for lung cancer. Pt declines for now.

## 2013-08-19 NOTE — Assessment & Plan Note (Signed)
Patient has history of hematuria in the past. He reports that repeat testing performed by his employer was normal. Will recheck urinalysis today.

## 2013-08-19 NOTE — Assessment & Plan Note (Signed)
Will check lipids and LFTs with labs today. Continue Atorvastatin. 

## 2013-08-20 ENCOUNTER — Encounter: Payer: BC Managed Care – PPO | Admitting: Internal Medicine

## 2013-08-20 LAB — COMPREHENSIVE METABOLIC PANEL
ALT: 24 U/L (ref 0–53)
AST: 21 U/L (ref 0–37)
Alkaline Phosphatase: 62 U/L (ref 39–117)
Creatinine, Ser: 1.1 mg/dL (ref 0.4–1.5)
GFR: 88.22 mL/min (ref >60.00)
Sodium: 135 mEq/L (ref 135–145)
Total Bilirubin: 0.5 mg/dL (ref 0.3–1.2)
Total Protein: 7 g/dL (ref 6.0–8.3)

## 2013-08-20 LAB — LIPID PANEL
HDL: 42.1 mg/dL (ref >39.00)
Triglycerides: 273 mg/dL — ABNORMAL HIGH (ref 0.0–149.0)

## 2013-08-20 LAB — MICROALBUMIN / CREATININE URINE RATIO: Creatinine,U: 134.7 mg/dL

## 2013-08-20 LAB — PSA, TOTAL AND FREE: PSA: 1.37 ng/mL (ref <=4.00)

## 2013-08-22 ENCOUNTER — Telehealth: Payer: Self-pay | Admitting: *Deleted

## 2013-08-22 MED ORDER — METFORMIN HCL 500 MG PO TABS: 500.0000 mg | ORAL_TABLET | Freq: Two times a day (BID) | ORAL | Status: DC

## 2013-08-22 NOTE — Telephone Encounter (Signed)
Patient informed of lab results and prescription sent to pharmacy on file. Scheduled to come in for diabetes eduction on Wed 9/24 at 430

## 2013-08-22 NOTE — Telephone Encounter (Signed)
Message copied by Theola Sequin on Fri Aug 22, 2013 10:20 AM ------      Message from: Ronna Polio A      Created: Wed Aug 20, 2013  3:28 PM       Labs show that blood sugars are in range of diabetes. I would recommend that we consider starting Metformin 500mg  bid and bring pt in to have nurse instruction on how to check blood sugar. He will need to check BG several times per week and bring to next visit in 1 month. ------

## 2013-08-27 ENCOUNTER — Ambulatory Visit (INDEPENDENT_AMBULATORY_CARE_PROVIDER_SITE_OTHER): Payer: BC Managed Care – PPO | Admitting: *Deleted

## 2013-08-27 DIAGNOSIS — E119 Type 2 diabetes mellitus without complications: Secondary | ICD-10-CM

## 2013-08-27 MED ORDER — GLUCOSE BLOOD VI STRP: ORAL_STRIP | Status: DC

## 2013-08-27 MED ORDER — MICROLET LANCETS MISC: 1.0000 | Freq: Two times a day (BID) | Status: DC

## 2013-08-27 MED ORDER — METFORMIN HCL 500 MG PO TABS: 500.0000 mg | ORAL_TABLET | Freq: Two times a day (BID) | ORAL | Status: DC

## 2013-09-26 ENCOUNTER — Ambulatory Visit (INDEPENDENT_AMBULATORY_CARE_PROVIDER_SITE_OTHER): Payer: BC Managed Care – PPO | Admitting: Internal Medicine

## 2013-09-26 ENCOUNTER — Encounter: Payer: Self-pay | Admitting: Internal Medicine

## 2013-09-26 VITALS — BP 118/72 | HR 85 | Temp 97.8°F | Resp 12

## 2013-09-26 DIAGNOSIS — E119 Type 2 diabetes mellitus without complications: Secondary | ICD-10-CM | POA: Insufficient documentation

## 2013-09-26 DIAGNOSIS — I10 Essential (primary) hypertension: Secondary | ICD-10-CM

## 2013-09-26 DIAGNOSIS — E785 Hyperlipidemia, unspecified: Secondary | ICD-10-CM

## 2013-09-26 MED ORDER — AMLODIPINE BESYLATE 5 MG PO TABS: 5.0000 mg | ORAL_TABLET | Freq: Every day | ORAL | Status: DC

## 2013-09-26 MED ORDER — ATORVASTATIN CALCIUM 20 MG PO TABS: 20.0000 mg | ORAL_TABLET | Freq: Every day | ORAL | Status: DC

## 2013-09-26 MED ORDER — METFORMIN HCL 500 MG PO TABS: 500.0000 mg | ORAL_TABLET | Freq: Two times a day (BID) | ORAL | Status: DC

## 2013-09-26 MED ORDER — LISINOPRIL-HYDROCHLOROTHIAZIDE 20-25 MG PO TABS: 1.0000 | ORAL_TABLET | Freq: Every day | ORAL | Status: DC

## 2013-09-26 MED ORDER — GLUCOSE BLOOD VI STRP: ORAL_STRIP | Status: DC

## 2013-09-27 NOTE — Assessment & Plan Note (Signed)
Will check LFTs and lipids with labs prior to next visit.

## 2013-09-27 NOTE — Assessment & Plan Note (Signed)
Blood sugars very well controlled with Metformin. Will plan to recheck A1c in 11/2013. Foot exam normal today. Pt declines pneumovax and flu vaccines.

## 2013-09-27 NOTE — Progress Notes (Signed)
Subjective:    Patient ID: Michael Roy, male    DOB: 11/13/1946, 67 y.o.   MRN: [id number]  HPI 67YO male with h/o DM, HTN, HL presents for follow up. He was recently diagnosed with DM and started on Metformin. He has been tolerating the medication well. He brings report of BG, which have been mostly between 80-120. No BG <70 or >200. No other new concerns today.  Outpatient Prescriptions Prior to Visit  Medication Sig Dispense Refill  . Cholecalciferol (VITAMIN D3) 400 UNITS CAPS Take 1 capsule by mouth daily.      Marland Kitchen MICROLET LANCETS MISC 1 each by Does not apply route 2 (two) times daily.  100 each  6  . amLODipine (NORVASC) 5 MG tablet Take 1 tablet (5 mg total) by mouth daily.  90 tablet  0  . atorvastatin (LIPITOR) 20 MG tablet Take 1 tablet (20 mg total) by mouth daily.  30 tablet  2  . glucose blood (BAYER CONTOUR NEXT TEST) test strip Use as instructed  100 each  12  . lisinopril-hydrochlorothiazide (PRINZIDE,ZESTORETIC) 20-25 MG per tablet Take 1 tablet by mouth daily.  90 tablet  0  . metFORMIN (GLUCOPHAGE) 500 MG tablet Take 1 tablet (500 mg total) by mouth 2 (two) times daily with a meal.  60 tablet  3  . Aspirin-Salicylamide-Caffeine (BC HEADACHE POWDER PO) Take 1 packet by mouth as needed.      . meloxicam (MOBIC) 15 MG tablet Take 15 mg by mouth daily.       No facility-administered medications prior to visit.   BP 118/72  Pulse 85  Temp(Src) 97.8 F (36.6 C) (Oral)  Resp 12  SpO2 97%  Review of Systems  Constitutional: Negative for fever, chills, activity change, appetite change, fatigue and unexpected weight change.  Eyes: Negative for visual disturbance.  Respiratory: Negative for cough and shortness of breath.   Cardiovascular: Negative for chest pain, palpitations and leg swelling.  Gastrointestinal: Negative for abdominal pain and abdominal distention.  Genitourinary: Negative for dysuria, urgency and difficulty urinating.  Musculoskeletal: Negative for  arthralgias and gait problem.  Skin: Negative for color change and rash.  Hematological: Negative for adenopathy.  Psychiatric/Behavioral: Negative for sleep disturbance and dysphoric mood. The patient is not nervous/anxious.        Objective:   Physical Exam  Constitutional: He is oriented to person, place, and time. He appears well-developed and well-nourished. No distress.  HENT:  Head: Normocephalic and atraumatic.  Right Ear: External ear normal.  Left Ear: External ear normal.  Nose: Nose normal.  Mouth/Throat: Oropharynx is clear and moist. No oropharyngeal exudate.  Eyes: Conjunctivae and EOM are normal. Pupils are equal, round, and reactive to light. Right eye exhibits no discharge. Left eye exhibits no discharge. No scleral icterus.  Neck: Normal range of motion. Neck supple. No tracheal deviation present. No thyromegaly present.  Cardiovascular: Normal rate, regular rhythm and normal heart sounds.  Exam reveals no gallop and no friction rub.   No murmur heard. Pulmonary/Chest: Effort normal and breath sounds normal. No respiratory distress. He has no wheezes. He has no rales. He exhibits no tenderness.  Musculoskeletal: Normal range of motion. He exhibits no edema.  Lymphadenopathy:    He has no cervical adenopathy.  Neurological: He is alert and oriented to person, place, and time. No cranial nerve deficit. Coordination normal.  Skin: Skin is warm and dry. No rash noted. He is not diaphoretic. No erythema. No pallor.  Psychiatric: He has  a normal mood and affect. His behavior is normal. Judgment and thought content normal.          Assessment & Plan:

## 2013-09-27 NOTE — Assessment & Plan Note (Signed)
BP Readings from Last 3 Encounters:  09/26/13 118/72  08/19/13 118/84  02/12/13 122/80   BP well controlled on current medication. Will continue.

## 2013-09-30 ENCOUNTER — Telehealth: Payer: Self-pay | Admitting: Internal Medicine

## 2013-09-30 NOTE — Telephone Encounter (Signed)
Pt  Spouse came in today wanting to know where she can get mr Bunner test strips   [facility] is out of them.  And insurance stated he could not get any more until nov 19.  Spouse stated they would by them with out insurance they needed to know what to do. Where to go get test strips  Do they need a rx for this  Pt is completely out of the test strip  Please call Dois Davenport  [phone]

## 2013-09-30 NOTE — Telephone Encounter (Signed)
Spoke with patient wife, informed her that she could try going to [facility] to transfer the prescription since she want to pay out of pocket for them and [facility] does not have any. She will try that then let us know.

## 2013-11-03 NOTE — Progress Notes (Signed)
Patient came in today to learn how to check his blood sugars at home with new diabetic machine given to him today. Demonstrated to patient how to clean the area and then stick his finger. Then allow the testing strip to absorb the blood, then he will get a reading. Patient then demonstrated this procedure to me before leaving the office today. While in office his BS was 112 and the last time he had eaten was about 12:00.

## 2013-12-23 ENCOUNTER — Ambulatory Visit (INDEPENDENT_AMBULATORY_CARE_PROVIDER_SITE_OTHER): Payer: BC Managed Care – PPO | Admitting: Internal Medicine

## 2013-12-23 ENCOUNTER — Encounter: Payer: Self-pay | Admitting: Internal Medicine

## 2013-12-23 VITALS — BP 120/78 | HR 99 | Temp 98.6°F | Wt 162.0 lb

## 2013-12-23 DIAGNOSIS — E119 Type 2 diabetes mellitus without complications: Secondary | ICD-10-CM

## 2013-12-23 DIAGNOSIS — IMO0001 Reserved for inherently not codable concepts without codable children: Secondary | ICD-10-CM

## 2013-12-23 DIAGNOSIS — I10 Essential (primary) hypertension: Secondary | ICD-10-CM

## 2013-12-23 DIAGNOSIS — S60949A Unspecified superficial injury of unspecified finger, initial encounter: Secondary | ICD-10-CM

## 2013-12-23 LAB — HM DIABETES FOOT EXAM: HM Diabetic Foot Exam: NORMAL

## 2013-12-23 NOTE — Progress Notes (Signed)
Pre-visit discussion using our clinic review tool. No additional management support is needed unless otherwise documented below in the visit note.  

## 2013-12-24 ENCOUNTER — Encounter: Payer: Self-pay | Admitting: *Deleted

## 2013-12-24 LAB — COMPREHENSIVE METABOLIC PANEL
ALK PHOS: 70 U/L (ref 39–117)
ALT: 43 U/L (ref 0–53)
AST: 30 U/L (ref 0–37)
Albumin: 4.2 g/dL (ref 3.5–5.2)
BUN: 17 mg/dL (ref 6–23)
CO2: 26 meq/L (ref 19–32)
Calcium: 9.8 mg/dL (ref 8.4–10.5)
Chloride: 106 mEq/L (ref 96–112)
Creatinine, Ser: 1.2 mg/dL (ref 0.4–1.5)
GFR: 79.56 mL/min (ref >60.00)
GLUCOSE: 101 mg/dL — AB (ref 70–99)
POTASSIUM: 4.4 meq/L (ref 3.5–5.1)
SODIUM: 138 meq/L (ref 135–145)
TOTAL PROTEIN: 7.5 g/dL (ref 6.0–8.3)
Total Bilirubin: 0.5 mg/dL (ref 0.3–1.2)

## 2013-12-24 LAB — MICROALBUMIN / CREATININE URINE RATIO
Creatinine,U: 201.5 mg/dL
MICROALB/CREAT RATIO: 0.2 mg/g (ref 0.0–30.0)
Microalb, Ur: 0.4 mg/dL (ref 0.0–1.9)

## 2013-12-24 LAB — HEMOGLOBIN A1C: Hgb A1c MFr Bld: 6.5 % (ref 4.6–6.5)

## 2013-12-24 NOTE — Progress Notes (Signed)
Subjective:    Patient ID: Michael Roy, male    DOB: November 11, 1946, 68 y.o.   MRN: [id number]  HPI 68 year old male with history of diabetes, hypertension, hyperlipidemia presents for followup. He brings record of his blood sugars which show most sugars near 90. He is compliant with medications. His only concern today is a wound on his right middle finger. He thinks that this occurred when he was using a finishing up and it was caught in his finger. However, his wife reports that he was recently working with shards of glass. The wound has a dark brown discoloration. It frequently bleeds. He is not up-to-date on tetanus shot. He denies any redness around the wound, purulent drainage, fever, or chills.  Outpatient Prescriptions Prior to Visit  Medication Sig Dispense Refill  . amLODipine (NORVASC) 5 MG tablet Take 1 tablet (5 mg total) by mouth daily.  30 tablet  11  . Aspirin-Salicylamide-Caffeine (BC HEADACHE POWDER PO) Take 1 packet by mouth as needed.      Marland Kitchen atorvastatin (LIPITOR) 20 MG tablet Take 1 tablet (20 mg total) by mouth daily.  30 tablet  11  . Cholecalciferol (VITAMIN D3) 400 UNITS CAPS Take 1 capsule by mouth daily.      Marland Kitchen glucose blood (BAYER CONTOUR NEXT TEST) test strip Use as instructed  100 each  12  . lisinopril-hydrochlorothiazide (PRINZIDE,ZESTORETIC) 20-25 MG per tablet Take 1 tablet by mouth daily.  30 tablet  11  . metFORMIN (GLUCOPHAGE) 500 MG tablet Take 1 tablet (500 mg total) by mouth 2 (two) times daily with a meal.  180 tablet  11  . MICROLET LANCETS MISC 1 each by Does not apply route 2 (two) times daily.  100 each  6   No facility-administered medications prior to visit.   BP 120/78  Pulse 99  Temp(Src) 98.6 F (37 C) (Oral)  Wt 162 lb (73.483 kg)  SpO2 95%  Review of Systems  Constitutional: Negative for fever, chills, activity change, appetite change, fatigue and unexpected weight change.  Eyes: Negative for visual disturbance.  Respiratory:  Negative for cough and shortness of breath.   Cardiovascular: Negative for chest pain, palpitations and leg swelling.  Gastrointestinal: Negative for abdominal pain and abdominal distention.  Genitourinary: Negative for dysuria, urgency and difficulty urinating.  Musculoskeletal: Negative for arthralgias and gait problem.  Skin: Negative for color change and rash.  Hematological: Negative for adenopathy.  Psychiatric/Behavioral: Negative for sleep disturbance and dysphoric mood. The patient is not nervous/anxious.        Objective:   Physical Exam  Constitutional: He is oriented to person, place, and time. He appears well-developed and well-nourished. No distress.  HENT:  Head: Normocephalic and atraumatic.  Right Ear: External ear normal.  Left Ear: External ear normal.  Nose: Nose normal.  Mouth/Throat: Oropharynx is clear and moist. No oropharyngeal exudate.  Eyes: Conjunctivae and EOM are normal. Pupils are equal, round, and reactive to light. Right eye exhibits no discharge. Left eye exhibits no discharge. No scleral icterus.  Neck: Normal range of motion. Neck supple. No tracheal deviation present. No thyromegaly present.  Cardiovascular: Normal rate, regular rhythm and normal heart sounds.  Exam reveals no gallop and no friction rub.   No murmur heard. Pulmonary/Chest: Effort normal and breath sounds normal. No respiratory distress. He has no wheezes. He has no rales. He exhibits no tenderness.  Musculoskeletal: Normal range of motion. He exhibits no edema.  Lymphadenopathy:    He has no cervical adenopathy.  Neurological: He is alert and oriented to person, place, and time. No cranial nerve deficit. Coordination normal.  Skin: Skin is warm and dry. No rash noted. He is not diaphoretic. No erythema. No pallor.     Psychiatric: He has a normal mood and affect. His behavior is normal. Judgment and thought content normal.          Assessment & Plan:

## 2013-12-24 NOTE — Assessment & Plan Note (Signed)
Will check A1c with labs today. Continue metformin. Foot exam normal today.

## 2013-12-24 NOTE — Assessment & Plan Note (Signed)
BP Readings from Last 3 Encounters:  12/23/13 120/78  09/26/13 118/72  08/19/13 118/84   Blood pressure well-controlled on current medications. Will check renal function with labs today.

## 2013-12-24 NOTE — Assessment & Plan Note (Signed)
Puncture wound, cleaned today with Betadine and triple antibiotic ointment applied. Recommended that patient get x-ray given that he has been recently working with shards of glass and question foreign body. He declines. Also recommended on several occasions that he get tetanus vaccine, particularly given this recent injury. He declines. He will continue to monitor at home. He will call if any worsening redness, swelling, or other concerns.

## 2013-12-26 ENCOUNTER — Telehealth: Payer: Self-pay

## 2013-12-26 NOTE — Telephone Encounter (Signed)
Relevant patient education mailed to patient.  

## 2014-01-06 ENCOUNTER — Telehealth: Payer: Self-pay | Admitting: Internal Medicine

## 2014-01-06 NOTE — Telephone Encounter (Signed)
Relevant patient education mailed to patient.  

## 2014-01-07 ENCOUNTER — Telehealth: Payer: Self-pay | Admitting: Internal Medicine

## 2014-01-07 NOTE — Telephone Encounter (Signed)
Relevant patient education mailed to patient.  

## 2014-02-17 ENCOUNTER — Ambulatory Visit: Payer: BC Managed Care – PPO | Admitting: Internal Medicine

## 2014-03-09 LAB — HM DIABETES EYE EXAM

## 2014-03-24 ENCOUNTER — Encounter: Payer: Self-pay | Admitting: Internal Medicine

## 2014-03-24 ENCOUNTER — Ambulatory Visit: Payer: BC Managed Care – PPO | Admitting: Internal Medicine

## 2014-03-27 ENCOUNTER — Ambulatory Visit (INDEPENDENT_AMBULATORY_CARE_PROVIDER_SITE_OTHER): Payer: BC Managed Care – PPO | Admitting: Internal Medicine

## 2014-03-27 ENCOUNTER — Encounter: Payer: Self-pay | Admitting: Internal Medicine

## 2014-03-27 VITALS — BP 120/78 | HR 81 | Resp 14 | Wt 166.0 lb

## 2014-03-27 DIAGNOSIS — I10 Essential (primary) hypertension: Secondary | ICD-10-CM

## 2014-03-27 DIAGNOSIS — E119 Type 2 diabetes mellitus without complications: Secondary | ICD-10-CM

## 2014-03-27 DIAGNOSIS — E785 Hyperlipidemia, unspecified: Secondary | ICD-10-CM

## 2014-03-27 NOTE — Progress Notes (Signed)
   Subjective:    Patient ID: Michael Roy, male    DOB: 1946/04/21, 68 y.o.   MRN: [id number]  HPI 68YO male presents for follow up.  DM - BG 96-120. Compliant with meds. No new concerns today.  Review of Systems  Constitutional: Negative for fever, chills, activity change, appetite change, fatigue and unexpected weight change.  Eyes: Negative for visual disturbance.  Respiratory: Negative for cough and shortness of breath.   Cardiovascular: Negative for chest pain, palpitations and leg swelling.  Gastrointestinal: Negative for abdominal pain and abdominal distention.  Genitourinary: Negative for dysuria, urgency and difficulty urinating.  Musculoskeletal: Negative for arthralgias and gait problem.  Skin: Negative for color change and rash.  Hematological: Negative for adenopathy.  Psychiatric/Behavioral: Negative for sleep disturbance and dysphoric mood. The patient is not nervous/anxious.        Objective:    BP 120/78  Pulse 81  Resp 14  Wt 166 lb (75.297 kg)  SpO2 96% Physical Exam  Constitutional: He is oriented to person, place, and time. He appears well-developed and well-nourished. No distress.  HENT:  Head: Normocephalic and atraumatic.  Right Ear: External ear normal.  Left Ear: External ear normal.  Nose: Nose normal.  Mouth/Throat: Oropharynx is clear and moist. No oropharyngeal exudate.  Eyes: Conjunctivae and EOM are normal. Pupils are equal, round, and reactive to light. Right eye exhibits no discharge. Left eye exhibits no discharge. No scleral icterus.  Neck: Normal range of motion. Neck supple. No tracheal deviation present. No thyromegaly present.  Cardiovascular: Normal rate, regular rhythm and normal heart sounds.  Exam reveals no gallop and no friction rub.   No murmur heard. Pulmonary/Chest: Effort normal and breath sounds normal. No accessory muscle usage. Not tachypneic. No respiratory distress. He has no decreased breath sounds. He has no  wheezes. He has no rhonchi. He has no rales. He exhibits no tenderness.  Musculoskeletal: Normal range of motion. He exhibits no edema.  Lymphadenopathy:    He has no cervical adenopathy.  Neurological: He is alert and oriented to person, place, and time. No cranial nerve deficit. Coordination normal.  Skin: Skin is warm and dry. No rash noted. He is not diaphoretic. No erythema. No pallor.  Psychiatric: He has a normal mood and affect. His behavior is normal. Judgment and thought content normal.          Assessment & Plan:   Problem List Items Addressed This Visit   Diabetes type 2, controlled - Primary     Will check A1c with labs today.  Continue metformin.    Relevant Orders      Comprehensive metabolic panel      Hemoglobin A1c   Hyperlipidemia LDL goal < 70     Will check LFTs and lipids with labs. Continue Atorvastatin.    Relevant Orders      Lipid panel   Hypertension      BP Readings from Last 3 Encounters:  03/27/14 120/78  12/23/13 120/78  09/26/13 118/72   BP well controlled on current medications. Will continue.        Return in about 3 months (around 06/26/2014) for Wellness Visit.

## 2014-03-27 NOTE — Assessment & Plan Note (Signed)
BP Readings from Last 3 Encounters:  03/27/14 120/78  12/23/13 120/78  09/26/13 118/72   BP well controlled on current medications. Will continue.

## 2014-03-27 NOTE — Progress Notes (Signed)
Pre visit review using our clinic review tool, if applicable. No additional management support is needed unless otherwise documented below in the visit note. 

## 2014-03-27 NOTE — Assessment & Plan Note (Signed)
Will check LFTs and lipids with labs. Continue Atorvastatin.

## 2014-03-27 NOTE — Assessment & Plan Note (Signed)
Will check A1c with labs today. Continue metformin. 

## 2014-03-28 LAB — HEMOGLOBIN A1C
Hgb A1c MFr Bld: 6 % — ABNORMAL HIGH (ref <5.7)
Mean Plasma Glucose: 126 mg/dL — ABNORMAL HIGH (ref <117)

## 2014-03-28 LAB — COMPREHENSIVE METABOLIC PANEL
ALK PHOS: 70 U/L (ref 39–117)
ALT: 19 U/L (ref 0–53)
AST: 19 U/L (ref 0–37)
Albumin: 4.4 g/dL (ref 3.5–5.2)
BUN: 18 mg/dL (ref 6–23)
CO2: 27 mEq/L (ref 19–32)
CREATININE: 0.99 mg/dL (ref 0.50–1.35)
Calcium: 9.4 mg/dL (ref 8.4–10.5)
Chloride: 99 mEq/L (ref 96–112)
Glucose, Bld: 79 mg/dL (ref 70–99)
POTASSIUM: 4.8 meq/L (ref 3.5–5.3)
Sodium: 133 mEq/L — ABNORMAL LOW (ref 135–145)
Total Bilirubin: 0.6 mg/dL (ref 0.2–1.2)
Total Protein: 6.7 g/dL (ref 6.0–8.3)

## 2014-03-28 LAB — LIPID PANEL
CHOL/HDL RATIO: 3.7 ratio
CHOLESTEROL: 151 mg/dL (ref 0–200)
HDL: 41 mg/dL (ref >39)
LDL Cholesterol: 55 mg/dL (ref 0–99)
TRIGLYCERIDES: 274 mg/dL — AB (ref <150)
VLDL: 55 mg/dL — ABNORMAL HIGH (ref 0–40)

## 2014-03-30 ENCOUNTER — Telehealth: Payer: Self-pay | Admitting: Internal Medicine

## 2014-03-30 ENCOUNTER — Encounter: Payer: Self-pay | Admitting: *Deleted

## 2014-03-30 NOTE — Telephone Encounter (Signed)
Relevant patient education mailed to patient.  

## 2014-07-09 ENCOUNTER — Encounter: Payer: Self-pay | Admitting: Internal Medicine

## 2014-07-09 ENCOUNTER — Ambulatory Visit (INDEPENDENT_AMBULATORY_CARE_PROVIDER_SITE_OTHER): Payer: BC Managed Care – PPO | Admitting: Internal Medicine

## 2014-07-09 VITALS — BP 118/62 | HR 86 | Temp 98.2°F | Resp 14 | Ht 69.5 in | Wt 162.5 lb

## 2014-07-09 DIAGNOSIS — Z720 Tobacco use: Secondary | ICD-10-CM

## 2014-07-09 DIAGNOSIS — I10 Essential (primary) hypertension: Secondary | ICD-10-CM

## 2014-07-09 DIAGNOSIS — E119 Type 2 diabetes mellitus without complications: Secondary | ICD-10-CM

## 2014-07-09 DIAGNOSIS — Z0000 Encounter for general adult medical examination without abnormal findings: Secondary | ICD-10-CM | POA: Insufficient documentation

## 2014-07-09 DIAGNOSIS — Z1211 Encounter for screening for malignant neoplasm of colon: Secondary | ICD-10-CM

## 2014-07-09 DIAGNOSIS — M79644 Pain in right finger(s): Secondary | ICD-10-CM

## 2014-07-09 DIAGNOSIS — F17200 Nicotine dependence, unspecified, uncomplicated: Secondary | ICD-10-CM

## 2014-07-09 DIAGNOSIS — M79609 Pain in unspecified limb: Secondary | ICD-10-CM

## 2014-07-09 LAB — HM DIABETES FOOT EXAM: HM DIABETIC FOOT EXAM: NORMAL

## 2014-07-09 NOTE — Assessment & Plan Note (Signed)
Strongly encouraged him to follow up with Dr. Hilarie Fredrickson for repeat colonoscopy. He declines.

## 2014-07-09 NOTE — Assessment & Plan Note (Signed)
BP Readings from Last 3 Encounters:  07/09/14 118/62  03/27/14 120/78  12/23/13 120/78   BP well controlled. Renal function with labs today. Continue current medications.

## 2014-07-09 NOTE — Progress Notes (Signed)
Subjective:    Patient ID: Michael Roy, male    DOB: January 11, 1946, 68 y.o.   MRN: [id number]  HPI 68YO male presents for annual exam.  Feeling well. Appetite good. Physically active at work.  DM - BG running near 120-130  No concerns today.  Declines vaccinations. Declines referral for follow up colonoscopy.  Review of Systems  Constitutional: Negative for fever, chills, activity change, appetite change, fatigue and unexpected weight change.  Eyes: Negative for visual disturbance.  Respiratory: Negative for cough and shortness of breath.   Cardiovascular: Negative for chest pain, palpitations and leg swelling.  Gastrointestinal: Negative for nausea, vomiting, abdominal pain, diarrhea, constipation, blood in stool and abdominal distention.  Genitourinary: Negative for dysuria, urgency and difficulty urinating.  Musculoskeletal: Negative for arthralgias and gait problem.  Skin: Negative for color change and rash.  Hematological: Negative for adenopathy.  Psychiatric/Behavioral: Negative for sleep disturbance and dysphoric mood. The patient is not nervous/anxious.        Objective:    BP 118/62  Pulse 86  Temp(Src) 98.2 F (36.8 C) (Oral)  Resp 14  Ht 5' 9.5" (1.765 m)  Wt 162 lb 8 oz (73.71 kg)  BMI 23.66 kg/m2  SpO2 97% Physical Exam  Constitutional: He is oriented to person, place, and time. He appears well-developed and well-nourished. No distress.  HENT:  Head: Normocephalic and atraumatic.  Right Ear: External ear normal.  Left Ear: External ear normal.  Nose: Nose normal.  Mouth/Throat: Oropharynx is clear and moist. No oropharyngeal exudate.  Eyes: Conjunctivae and EOM are normal. Pupils are equal, round, and reactive to light. Right eye exhibits no discharge. Left eye exhibits no discharge. No scleral icterus.  Neck: Normal range of motion. Neck supple. No tracheal deviation present. No thyromegaly present.  Cardiovascular: Normal rate, regular rhythm  and normal heart sounds.  Exam reveals no gallop and no friction rub.   No murmur heard. Pulmonary/Chest: Effort normal and breath sounds normal. No accessory muscle usage. Not tachypneic. No respiratory distress. He has no decreased breath sounds. He has no wheezes. He has no rhonchi. He has no rales. He exhibits no tenderness.  Abdominal: Soft. Bowel sounds are normal. He exhibits no distension and no mass. There is no tenderness. There is no rebound and no guarding.  Musculoskeletal: Normal range of motion. He exhibits no edema.  Lymphadenopathy:    He has no cervical adenopathy.  Neurological: He is alert and oriented to person, place, and time. No cranial nerve deficit. Coordination normal.  Skin: Skin is warm and dry. No rash noted. He is not diaphoretic. No erythema. No pallor.  Foot exam including visual inspection, and monofilament testing was normal bilaterally.  Psychiatric: He has a normal mood and affect. His behavior is normal. Judgment and thought content normal.          Assessment & Plan:   Problem List Items Addressed This Visit     Unprioritized   Diabetes type 2, controlled      Lab Results  Component Value Date   HGBA1C 6.0* 03/27/2014  Will check A1c with labs today. Foot exam normal. Declines vaccinations.    Hypertension      BP Readings from Last 3 Encounters:  07/09/14 118/62  03/27/14 120/78  12/23/13 120/78   BP well controlled. Renal function with labs today. Continue current medications.    Routine general medical examination at a health care facility - Primary     General medical exam normal today. Declines cancer  screening including follow up colonoscopy. Declines vaccinations including Pneumovax or Prevnar. Will check labs today including CBC, CMP, lipids, A1c, and PSA. Discussed limitations of PSA testing.    Relevant Orders      Comprehensive metabolic panel      Hemoglobin A1c      Lipid panel      Microalbumin / creatinine urine ratio        PSA, total and free      EKG 12-Lead   Screening for colon cancer     Strongly encouraged him to follow up with Dr. Hilarie Fredrickson for repeat colonoscopy. He declines.    Tobacco abuse     Encouraged smoking cessation. Encouraged him to consider Chest CT for screening for lung cancer. He will wait until covered under Medicare.        Return in about 6 months (around 01/09/2015) for Recheck of Diabetes.

## 2014-07-09 NOTE — Assessment & Plan Note (Signed)
General medical exam normal today. Declines cancer screening including follow up colonoscopy. Declines vaccinations including Pneumovax or Prevnar. Will check labs today including CBC, CMP, lipids, A1c, and PSA. Discussed limitations of PSA testing.

## 2014-07-09 NOTE — Patient Instructions (Signed)

## 2014-07-09 NOTE — Assessment & Plan Note (Signed)
Lab Results  Component Value Date   HGBA1C 6.0* 03/27/2014  Will check A1c with labs today. Foot exam normal. Declines vaccinations.

## 2014-07-09 NOTE — Assessment & Plan Note (Signed)
Encouraged smoking cessation. Encouraged him to consider Chest CT for screening for lung cancer. He will wait until covered under Medicare.

## 2014-07-09 NOTE — Progress Notes (Signed)
Pre visit review using our clinic review tool, if applicable. No additional management support is needed unless otherwise documented below in the visit note. 

## 2014-07-10 ENCOUNTER — Encounter: Payer: Self-pay | Admitting: *Deleted

## 2014-07-10 LAB — LIPID PANEL
CHOL/HDL RATIO: 4
Cholesterol: 157 mg/dL (ref 0–200)
HDL: 39.4 mg/dL (ref >39.00)
NONHDL: 117.6
Triglycerides: 329 mg/dL — ABNORMAL HIGH (ref 0.0–149.0)
VLDL: 65.8 mg/dL — AB (ref 0.0–40.0)

## 2014-07-10 LAB — COMPREHENSIVE METABOLIC PANEL
ALBUMIN: 4.2 g/dL (ref 3.5–5.2)
ALK PHOS: 69 U/L (ref 39–117)
ALT: 28 U/L (ref 0–53)
AST: 27 U/L (ref 0–37)
BUN: 24 mg/dL — ABNORMAL HIGH (ref 6–23)
CHLORIDE: 100 meq/L (ref 96–112)
CO2: 28 mEq/L (ref 19–32)
Calcium: 9.4 mg/dL (ref 8.4–10.5)
Creatinine, Ser: 1.3 mg/dL (ref 0.4–1.5)
GFR: 74.32 mL/min (ref >60.00)
Glucose, Bld: 92 mg/dL (ref 70–99)
POTASSIUM: 4.4 meq/L (ref 3.5–5.1)
SODIUM: 134 meq/L — AB (ref 135–145)
Total Bilirubin: 0.6 mg/dL (ref 0.2–1.2)
Total Protein: 7.3 g/dL (ref 6.0–8.3)

## 2014-07-10 LAB — PSA, TOTAL AND FREE
PSA, Free Pct: 48 % (ref >25)
PSA, Free: 0.68 ng/mL
PSA: 1.41 ng/mL (ref <=4.00)

## 2014-07-10 LAB — MICROALBUMIN / CREATININE URINE RATIO
Creatinine,U: 201 mg/dL
Microalb Creat Ratio: 0.3 mg/g (ref 0.0–30.0)
Microalb, Ur: 0.6 mg/dL (ref 0.0–1.9)

## 2014-07-10 LAB — LDL CHOLESTEROL, DIRECT: Direct LDL: 101.1 mg/dL

## 2014-07-10 LAB — HEMOGLOBIN A1C: Hgb A1c MFr Bld: 6.5 % (ref 4.6–6.5)

## 2014-07-13 ENCOUNTER — Other Ambulatory Visit: Payer: Self-pay | Admitting: *Deleted

## 2014-07-13 MED ORDER — ATORVASTATIN CALCIUM 40 MG PO TABS: 40.0000 mg | ORAL_TABLET | Freq: Every day | ORAL | Status: DC

## 2014-09-22 ENCOUNTER — Other Ambulatory Visit: Payer: Self-pay | Admitting: Internal Medicine

## 2014-09-22 ENCOUNTER — Telehealth: Payer: Self-pay | Admitting: Internal Medicine

## 2014-09-22 NOTE — Telephone Encounter (Signed)
Pt wife stopped by after going to the pharmacy to request a refill on  Lisinopril/HCTZ 20gm and Amlodipine 5gm tab. Pt wife just wanted to make sure the rx was refilled.msn

## 2014-09-22 NOTE — Telephone Encounter (Signed)
It was refilled today.

## 2014-10-30 ENCOUNTER — Other Ambulatory Visit: Payer: Self-pay | Admitting: Internal Medicine

## 2015-01-11 ENCOUNTER — Ambulatory Visit (INDEPENDENT_AMBULATORY_CARE_PROVIDER_SITE_OTHER): Payer: Medicare Other | Admitting: Internal Medicine

## 2015-01-11 ENCOUNTER — Encounter: Payer: Self-pay | Admitting: Internal Medicine

## 2015-01-11 VITALS — BP 124/80 | HR 83 | Temp 97.8°F | Ht 69.5 in | Wt 164.0 lb

## 2015-01-11 DIAGNOSIS — R202 Paresthesia of skin: Secondary | ICD-10-CM

## 2015-01-11 DIAGNOSIS — E119 Type 2 diabetes mellitus without complications: Secondary | ICD-10-CM

## 2015-01-11 DIAGNOSIS — Z720 Tobacco use: Secondary | ICD-10-CM

## 2015-01-11 DIAGNOSIS — I10 Essential (primary) hypertension: Secondary | ICD-10-CM

## 2015-01-11 DIAGNOSIS — E785 Hyperlipidemia, unspecified: Secondary | ICD-10-CM

## 2015-01-11 DIAGNOSIS — R200 Anesthesia of skin: Secondary | ICD-10-CM

## 2015-01-11 DIAGNOSIS — Z86010 Personal history of colonic polyps: Secondary | ICD-10-CM

## 2015-01-11 NOTE — Patient Instructions (Addendum)
Labs today.  Follow up in 6 months.  We will set up evaluation with Dr. Hilarie Fredrickson.  Consider referral to neurosurgery for cervical spinal stenosis.  We will set up CT chest for evaluation.

## 2015-01-11 NOTE — Assessment & Plan Note (Signed)
Encouraged smoking cessation. Will set up CT chest for lung cancer screen.

## 2015-01-11 NOTE — Assessment & Plan Note (Signed)
Referral placed for follow up colonoscopy

## 2015-01-11 NOTE — Progress Notes (Signed)
Subjective:    Patient ID: Michael Roy, male    DOB: [birth year], 69 y.o.   MRN: [id number]  HPI 69YO male presents for follow up.  DM - Compliant with medication. Does not check BG  Only concern today is persistent left>right hand numbness. MRI cervical spine in the past showed spinal stenosis. He declines referral to neurosurgery.  Continues to smoke. Not ready to quit.   Past medical, surgical, family and social history per today's encounter.  Review of Systems  Constitutional: Negative for fever, chills, activity change, appetite change, fatigue and unexpected weight change.  Eyes: Negative for visual disturbance.  Respiratory: Negative for cough and shortness of breath.   Cardiovascular: Negative for chest pain, palpitations and leg swelling.  Gastrointestinal: Negative for nausea, vomiting, abdominal pain, diarrhea, constipation and abdominal distention.  Genitourinary: Negative for dysuria, urgency and difficulty urinating.  Musculoskeletal: Negative for myalgias, arthralgias, gait problem, neck pain and neck stiffness.  Skin: Negative for color change and rash.  Neurological: Positive for numbness (hands). Negative for weakness.  Hematological: Negative for adenopathy.  Psychiatric/Behavioral: Negative for sleep disturbance and dysphoric mood. The patient is not nervous/anxious.        Objective:    BP 124/80 mmHg  Pulse 83  Temp(Src) 97.8 F (36.6 C) (Oral)  Ht 5' 9.5" (1.765 m)  Wt 164 lb (74.39 kg)  BMI 23.88 kg/m2  SpO2 94% Physical Exam  Constitutional: He is oriented to person, place, and time. He appears well-developed and well-nourished. No distress.  HENT:  Head: Normocephalic and atraumatic.  Right Ear: External ear normal.  Left Ear: External ear normal.  Nose: Nose normal.  Mouth/Throat: Oropharynx is clear and moist. No oropharyngeal exudate.  Eyes: Conjunctivae and EOM are normal. Pupils are equal, round, and reactive to light. Right eye  exhibits no discharge. Left eye exhibits no discharge. No scleral icterus.  Neck: Normal range of motion. Neck supple. No tracheal deviation present. No thyromegaly present.  Cardiovascular: Normal rate, regular rhythm and normal heart sounds.  Exam reveals no gallop and no friction rub.   No murmur heard. Pulmonary/Chest: Effort normal and breath sounds normal. No accessory muscle usage. No tachypnea. No respiratory distress. He has no decreased breath sounds. He has no wheezes. He has no rhonchi. He has no rales. He exhibits no tenderness.  Musculoskeletal: Normal range of motion. He exhibits no edema.  Lymphadenopathy:    He has no cervical adenopathy.  Neurological: He is alert and oriented to person, place, and time. No cranial nerve deficit. Coordination normal.  Skin: Skin is warm and dry. No rash noted. He is not diaphoretic. No erythema. No pallor.  Psychiatric: He has a normal mood and affect. His behavior is normal. Judgment and thought content normal.          Assessment & Plan:   Problem List Items Addressed This Visit      Unprioritized   Diabetes type 2, controlled - Primary    Will check A1c with labs. Continue Metformin.      Relevant Orders   Comprehensive metabolic panel   Hemoglobin A1c   Lipid panel   Microalbumin / creatinine urine ratio   History of colonic polyps    Referral placed for follow up colonoscopy      Relevant Orders   Ambulatory referral to Gastroenterology   Hyperlipidemia    Will check lipids and LFTs with labs. Continue Atorvastatin.      Hypertension    BP Readings from Last  3 Encounters:  01/11/15 124/80  07/09/14 118/62  03/27/14 120/78   BP well controlled. Renal function with labs. Continue Lisinopril, HCTZ, Amlodipine.      Numbness and tingling in left hand    Reviewed previous cervical spine MRI showing DJD and spinal stenosis. Encouraged him to consider referral to neurosurgery for evaluation. He declines.       Tobacco abuse    Encouraged smoking cessation. Will set up CT chest for lung cancer screen.      Relevant Orders   CT CHEST LUNG CA SCREEN LOW DOSE W/O CM       Return in about 6 months (around 07/12/2015) for Wellness Visit.

## 2015-01-11 NOTE — Assessment & Plan Note (Signed)
Reviewed previous cervical spine MRI showing DJD and spinal stenosis. Encouraged him to consider referral to neurosurgery for evaluation. He declines.

## 2015-01-11 NOTE — Assessment & Plan Note (Signed)
BP Readings from Last 3 Encounters:  01/11/15 124/80  07/09/14 118/62  03/27/14 120/78   BP well controlled. Renal function with labs. Continue Lisinopril, HCTZ, Amlodipine.

## 2015-01-11 NOTE — Progress Notes (Signed)
Pre visit review using our clinic review tool, if applicable. No additional management support is needed unless otherwise documented below in the visit note. 

## 2015-01-11 NOTE — Assessment & Plan Note (Signed)
Will check A1c with labs. Continue Metformin. 

## 2015-01-11 NOTE — Assessment & Plan Note (Signed)
Will check lipids and LFTs with labs. Continue Atorvastatin. 

## 2015-01-12 LAB — COMPREHENSIVE METABOLIC PANEL
ALK PHOS: 63 U/L (ref 39–117)
ALT: 17 U/L (ref 0–53)
AST: 18 U/L (ref 0–37)
Albumin: 4.3 g/dL (ref 3.5–5.2)
BILIRUBIN TOTAL: 0.5 mg/dL (ref 0.2–1.2)
BUN: 21 mg/dL (ref 6–23)
CO2: 26 mEq/L (ref 19–32)
Calcium: 9.8 mg/dL (ref 8.4–10.5)
Chloride: 105 mEq/L (ref 96–112)
Creatinine, Ser: 1.17 mg/dL (ref 0.40–1.50)
GFR: 80.09 mL/min (ref >60.00)
Glucose, Bld: 97 mg/dL (ref 70–99)
Potassium: 4.6 mEq/L (ref 3.5–5.1)
SODIUM: 136 meq/L (ref 135–145)
TOTAL PROTEIN: 7.3 g/dL (ref 6.0–8.3)

## 2015-01-12 LAB — LIPID PANEL
Cholesterol: 218 mg/dL — ABNORMAL HIGH (ref 0–200)
HDL: 50.6 mg/dL (ref >39.00)
LDL CALC: 135 mg/dL — AB (ref 0–99)
NonHDL: 167.4
Total CHOL/HDL Ratio: 4
Triglycerides: 163 mg/dL — ABNORMAL HIGH (ref 0.0–149.0)
VLDL: 32.6 mg/dL (ref 0.0–40.0)

## 2015-01-12 LAB — HEMOGLOBIN A1C: Hgb A1c MFr Bld: 6.4 % (ref 4.6–6.5)

## 2015-01-12 LAB — MICROALBUMIN / CREATININE URINE RATIO
CREATININE, U: 149 mg/dL
MICROALB UR: 1.1 mg/dL (ref 0.0–1.9)
Microalb Creat Ratio: 0.7 mg/g (ref 0.0–30.0)

## 2015-01-20 ENCOUNTER — Encounter: Payer: Self-pay | Admitting: Internal Medicine

## 2015-02-02 ENCOUNTER — Ambulatory Visit
Admit: 2015-02-02 | Disposition: A | Discharge status: Home / Self Care | Payer: Self-pay | Attending: Family Medicine | Admitting: Family Medicine

## 2015-02-16 ENCOUNTER — Telehealth: Payer: Self-pay | Admitting: *Deleted

## 2015-02-16 NOTE — Telephone Encounter (Signed)
Left message, to call back to update flu vaccine status

## 2015-02-23 ENCOUNTER — Ambulatory Visit
Admit: 2015-02-23 | Disposition: A | Discharge status: Home / Self Care | Payer: Self-pay | Attending: Family Medicine | Admitting: Family Medicine

## 2015-02-24 ENCOUNTER — Ambulatory Visit: Payer: Self-pay | Admitting: Family Medicine

## 2015-02-25 ENCOUNTER — Telehealth: Payer: Self-pay | Admitting: Internal Medicine

## 2015-02-25 NOTE — Telephone Encounter (Signed)
Not opened by me

## 2015-03-02 ENCOUNTER — Ambulatory Visit (AMBULATORY_SURGERY_CENTER): Payer: Self-pay | Admitting: *Deleted

## 2015-03-02 VITALS — Ht 71.0 in | Wt 163.2 lb

## 2015-03-02 DIAGNOSIS — Z86010 Personal history of colonic polyps: Secondary | ICD-10-CM

## 2015-03-02 MED ORDER — MOVIPREP 100 G PO SOLR: 1.0000 | Freq: Once | ORAL | Status: DC

## 2015-03-02 NOTE — Progress Notes (Signed)
No egg or soy allergy No diet pills No home 02  No issues with past sedation Declined emmi video

## 2015-03-05 ENCOUNTER — Ambulatory Visit
Admit: 2015-03-05 | Disposition: A | Discharge status: Home / Self Care | Payer: Self-pay | Attending: Family Medicine | Admitting: Family Medicine

## 2015-03-16 ENCOUNTER — Encounter: Payer: Self-pay | Admitting: Internal Medicine

## 2015-03-16 ENCOUNTER — Ambulatory Visit (AMBULATORY_SURGERY_CENTER): Payer: BLUE CROSS/BLUE SHIELD | Admitting: Internal Medicine

## 2015-03-16 VITALS — BP 110/63 | HR 64 | Temp 96.6°F | Resp 21 | Ht 71.0 in | Wt 163.0 lb

## 2015-03-16 DIAGNOSIS — D124 Benign neoplasm of descending colon: Secondary | ICD-10-CM

## 2015-03-16 DIAGNOSIS — Z86010 Personal history of colonic polyps: Secondary | ICD-10-CM | POA: Diagnosis not present

## 2015-03-16 DIAGNOSIS — D125 Benign neoplasm of sigmoid colon: Secondary | ICD-10-CM | POA: Diagnosis not present

## 2015-03-16 DIAGNOSIS — D122 Benign neoplasm of ascending colon: Secondary | ICD-10-CM

## 2015-03-16 MED ORDER — SODIUM CHLORIDE 0.9 % IV SOLN: 500.0000 mL | INTRAVENOUS | Status: DC

## 2015-03-16 NOTE — Progress Notes (Signed)
Patient awakening,vss,report to rn 

## 2015-03-16 NOTE — Patient Instructions (Signed)
Findings:  Polyps Recommendations:  Colonoscopy in three years, high fiber diet  YOU HAD AN ENDOSCOPIC PROCEDURE TODAY AT [facility]:   Refer to the procedure report that was given to you for any specific questions about what was found during the examination.  If the procedure report does not answer your questions, please call your gastroenterologist to clarify.  If you requested that your care partner not be given the details of your procedure findings, then the procedure report has been included in a sealed envelope for you to review at your convenience later.  YOU SHOULD EXPECT: Some feelings of bloating in the abdomen. Passage of more gas than usual.  Walking can help get rid of the air that was put into your GI tract during the procedure and reduce the bloating. If you had a lower endoscopy (such as a colonoscopy or flexible sigmoidoscopy) you may notice spotting of blood in your stool or on the toilet paper. If you underwent a bowel prep for your procedure, you may not have a normal bowel movement for a few days.  Please Note:  You might notice some irritation and congestion in your nose or some drainage.  This is from the oxygen used during your procedure.  There is no need for concern and it should clear up in a day or so.  SYMPTOMS TO REPORT IMMEDIATELY:   Following lower endoscopy (colonoscopy or flexible sigmoidoscopy):  Excessive amounts of blood in the stool  Significant tenderness or worsening of abdominal pains  Swelling of the abdomen that is new, acute  Fever of 100F or higher   Following upper endoscopy (EGD)  Vomiting of blood or coffee ground material  New chest pain or pain under the shoulder blades  Painful or persistently difficult swallowing  New shortness of breath  Fever of 100F or higher  Black, tarry-looking stools  For urgent or emergent issues, a gastroenterologist can be reached at any hour by calling [phone].   DIET: Your first  meal following the procedure should be a small meal and then it is ok to progress to your normal diet. Heavy or fried foods are harder to digest and may make you feel nauseous or bloated.  Likewise, meals heavy in dairy and vegetables can increase bloating.  Drink plenty of fluids but you should avoid alcoholic beverages for 24 hours.  ACTIVITY:  You should plan to take it easy for the rest of today and you should NOT DRIVE or use heavy machinery until tomorrow (because of the sedation medicines used during the test).    FOLLOW UP: Our staff will call the number listed on your records the next business day following your procedure to check on you and address any questions or concerns that you may have regarding the information given to you following your procedure. If we do not reach you, we will leave a message.  However, if you are feeling well and you are not experiencing any problems, there is no need to return our call.  We will assume that you have returned to your regular daily activities without incident.  If any biopsies were taken you will be contacted by phone or by letter within the next 1-3 weeks.  Please call us at [phone] if you have not heard about the biopsies in 3 weeks.    SIGNATURES/CONFIDENTIALITY: You and/or your care partner have signed paperwork which will be entered into your electronic medical record.  These signatures attest to the fact  that that the information above on your After Visit Summary has been reviewed and is understood.  Full responsibility of the confidentiality of this discharge information lies with you and/or your care-partner.  Please follow all discharge instructions given to you by the recovery room nurse. If you have any questions or problems after discharge please call one of the numbers listed above. You will receive a phone call in the am to see how you are doing and answer any questions you may have. Thank you for choosing [facility] for your health care needs.

## 2015-03-16 NOTE — Op Note (Signed)
[facility]  [address] [place], [id number]   COLONOSCOPY PROCEDURE REPORT  PATIENT: Ell, Tiso  MR#: [id number] BIRTHDATE: 1946-10-17 , 27  yrs. old GENDER: male ENDOSCOPIST: Jerene Bears, MD PROCEDURE DATE:  03/16/2015 PROCEDURE:   Colonoscopy with snare polypectomy First Screening Colonoscopy - Avg.  risk and is 50 yrs.  old or older - No.  Prior Negative Screening - Now for repeat screening. N/A  History of Adenoma - Now for follow-up colonoscopy & has been > or = to 3 yrs.  No.  It has been less than 3 yrs since last colonoscopy.  Medical reason. ASA CLASS:   Class III INDICATIONS:Surveillance due to prior colonic neoplasia and PH Colon Adenoma. MEDICATIONS: Monitored anesthesia care and Propofol 300 mg IV  DESCRIPTION OF PROCEDURE:   After the risks benefits and alternatives of the procedure were thoroughly explained, informed consent was obtained.  The digital rectal exam revealed a skin tag. The LB CF-H180AL Loaner [id number]  endoscope was introduced through the anus and advanced to the cecum, which was identified by both the appendix and ileocecal valve. No adverse events experienced. The quality of the prep was (MoviPrep was used) good.  The instrument was then slowly withdrawn as the colon was fully examined.  COLON FINDINGS: Eight sessile polyps ranging from 3 to 35mm in size were found in the ascending colon (2), descending colon (4), and sigmoid colon (2).  Polypectomies were performed with a cold snare. The resection was complete, the polyp tissue was completely retrieved and sent to histology.   There was moderate diverticulosis noted in the ascending colon, descending colon, and sigmoid colon with associated muscular hypertrophy.   Small angiodysplastic lesion was found at the cecum.  Retroflexed views revealed internal hemorrhoids. The time to cecum = 3 min, 41 sec Withdrawal time = 17 min, 58 sec   The scope was withdrawn and  the procedure completed. COMPLICATIONS: There were no immediate complications.  ENDOSCOPIC IMPRESSION: 1.   Eight sessile polyps ranging from 3 to 75mm in size were found in the ascending colon, descending colon, and sigmoid colon; polypectomies were performed with a cold snare 2.   There was moderate diverticulosis noted in the ascending colon, descending colon, and sigmoid colon 3.   Small angiodysplastic lesion at the cecum  RECOMMENDATIONS: 1.  Await pathology results 2.  High fiber diet 3.  Repeat Colonoscopy in 3 years. 4.  You will receive a letter within 1-2 weeks with the results of your biopsy as well as final recommendations.  Please call my office if you have not received a letter after 3 weeks.  eSigned:  Jerene Bears, MD 03/16/2015 9:11 AM   cc: The Patient and Ronette Deter MD   PATIENT NAME:  Tyrone, Pautsch MR#: [id number]

## 2015-03-17 ENCOUNTER — Telehealth: Payer: Self-pay

## 2015-03-17 NOTE — Telephone Encounter (Signed)
  Follow up Call-  Call back number 03/16/2015 07/23/2012  Post procedure Call Back phone  # [phone]  Permission to leave phone message Yes Yes     Patient questions:  Do you have a fever, pain , or abdominal swelling? No. Pain Score  0 *  Have you tolerated food without any problems? Yes.    Have you been able to return to your normal activities? Yes.    Do you have any questions about your discharge instructions: Diet   No. Medications  No. Follow up visit  No.  Do you have questions or concerns about your Care? No.  Actions: * If pain score is 4 or above: No action needed, pain <4.  No problems per the pt. maw

## 2015-03-19 LAB — HM COLONOSCOPY

## 2015-03-29 ENCOUNTER — Encounter: Payer: Self-pay | Admitting: Internal Medicine

## 2015-06-07 ENCOUNTER — Other Ambulatory Visit: Payer: Self-pay | Admitting: Internal Medicine

## 2015-07-12 ENCOUNTER — Encounter: Payer: Medicare Other | Admitting: Internal Medicine

## 2015-07-16 ENCOUNTER — Encounter: Payer: Medicare Other | Admitting: Internal Medicine

## 2015-07-19 ENCOUNTER — Ambulatory Visit (INDEPENDENT_AMBULATORY_CARE_PROVIDER_SITE_OTHER): Payer: BLUE CROSS/BLUE SHIELD | Admitting: Internal Medicine

## 2015-07-19 ENCOUNTER — Encounter: Payer: Self-pay | Admitting: Internal Medicine

## 2015-07-19 VITALS — BP 112/74 | HR 76 | Temp 98.0°F | Ht 70.5 in | Wt 162.1 lb

## 2015-07-19 DIAGNOSIS — E119 Type 2 diabetes mellitus without complications: Secondary | ICD-10-CM

## 2015-07-19 DIAGNOSIS — R7989 Other specified abnormal findings of blood chemistry: Secondary | ICD-10-CM

## 2015-07-19 DIAGNOSIS — R202 Paresthesia of skin: Secondary | ICD-10-CM | POA: Diagnosis not present

## 2015-07-19 DIAGNOSIS — Z720 Tobacco use: Secondary | ICD-10-CM

## 2015-07-19 DIAGNOSIS — R200 Anesthesia of skin: Secondary | ICD-10-CM

## 2015-07-19 DIAGNOSIS — Z0000 Encounter for general adult medical examination without abnormal findings: Secondary | ICD-10-CM | POA: Diagnosis not present

## 2015-07-19 DIAGNOSIS — I10 Essential (primary) hypertension: Secondary | ICD-10-CM | POA: Diagnosis not present

## 2015-07-19 LAB — HM DIABETES FOOT EXAM: HM Diabetic Foot Exam: NORMAL

## 2015-07-19 NOTE — Progress Notes (Signed)
Pre visit review using our clinic review tool, if applicable. No additional management support is needed unless otherwise documented below in the visit note. 

## 2015-07-19 NOTE — Assessment & Plan Note (Signed)
General medical exam normal today. Labs as ordered. Encouraged smoking cessation. Encouraged healthy diet and physical activity. Colonoscopy UTD and reviewed. Immunizations declined. PSA with labs today. Discussed limitations of PSA testing.

## 2015-07-19 NOTE — Assessment & Plan Note (Signed)
Encouraged smoking cessation. Will request copy of last CT chest screening.

## 2015-07-19 NOTE — Progress Notes (Signed)
Subjective:    Patient ID: Michael Roy, male    DOB: 11-Dec-1945, 69 y.o.   MRN: [id number]  HPI  69YO male presents for physical exam.  Left arm numbness has worsened. At times, notes weakness in abduction of left arm. More bothersome at night, versus daytime.  Previous MRI showed degenerative changes in cervical spine. Prefers not to pursue additional treatment right now.  DM - Does not check BG. Compliant with meds.    Past Medical History  Diagnosis Date  . Arthritis   . Eating disorder   . Headache(784.0)   . Heart murmur   . Hypertension   . Hyperlipidemia   . Cervical spine fracture   . Diabetes mellitus     borderline  . Cataract     just starting  . Dry cough     smokers cough per pt.    Family History  Problem Relation Age of Onset  . Hypertension Mother   . Diabetes Mother   . Heart disease Mother   . Hypertension Father   . Cancer Sister     colon  . Heart disease Sister   . Stroke Sister   . Colon cancer Sister   . Alcohol abuse Brother   . Cancer Brother   . Sudden death Brother   . Colon polyps Brother   . Colon polyps Brother   . Colon polyps Brother   . Rectal cancer Neg Hx   . Stomach cancer Neg Hx    Past Surgical History  Procedure Laterality Date  . Polypectomy  07-2012    18 polyps removed   . Colonoscopy  07-3012  . Stitches Left     calf area as a child    Social History   Social History  . Marital Status: Married    Spouse Name: N/A  . Number of Children: N/A  . Years of Education: N/A   Social History Main Topics  . Smoking status: Current Every Day Smoker -- 1.00 packs/day    Types: Cigarettes  . Smokeless tobacco: Never Used  . Alcohol Use: 4.2 oz/week    7 Cans of beer per week     Comment: beer  . Drug Use: No  . Sexual Activity: Not Asked   Other Topics Concern  . None   Social History Narrative   Lives in [place]. 3 children.      Work - [employer]   Diet - healthy   Exercise - fishing      Review of Systems  Constitutional: Negative for fever, chills, activity change, appetite change, fatigue and unexpected weight change.  Eyes: Negative for visual disturbance.  Respiratory: Negative for cough and shortness of breath.   Cardiovascular: Negative for chest pain, palpitations and leg swelling.  Gastrointestinal: Negative for nausea, vomiting, abdominal pain, diarrhea, constipation and abdominal distention.  Genitourinary: Negative for dysuria, urgency, decreased urine volume and difficulty urinating.  Musculoskeletal: Positive for myalgias. Negative for arthralgias, gait problem, neck pain and neck stiffness.  Skin: Negative for color change and rash.  Neurological: Positive for weakness (left arm on occasion) and numbness (left hand). Negative for headaches.  Hematological: Negative for adenopathy.  Psychiatric/Behavioral: Negative for sleep disturbance and dysphoric mood. The patient is not nervous/anxious.        Objective:    BP 112/74 mmHg  Pulse 76  Temp(Src) 98 F (36.7 C) (Oral)  Ht 5' 10.5" (1.791 m)  Wt 162 lb 2 oz (73.539 kg)  BMI 22.93 kg/m2  SpO2 96% Physical Exam  Constitutional: He is oriented to person, place, and time. He appears well-developed and well-nourished. No distress.  HENT:  Head: Normocephalic and atraumatic.  Right Ear: External ear normal.  Left Ear: External ear normal.  Nose: Nose normal.  Mouth/Throat: Oropharynx is clear and moist. No oropharyngeal exudate.  Eyes: Conjunctivae and EOM are normal. Pupils are equal, round, and reactive to light. Right eye exhibits no discharge. Left eye exhibits no discharge. No scleral icterus.  Neck: Normal range of motion. Neck supple. No tracheal deviation present. No thyromegaly present.  Cardiovascular: Normal rate, regular rhythm and normal heart sounds.  Exam reveals no gallop and no friction rub.   No murmur heard. Pulmonary/Chest: Effort normal and breath sounds normal. No respiratory  distress. He has no wheezes. He has no rales. He exhibits no tenderness.  Abdominal: Soft. Bowel sounds are normal. He exhibits no distension and no mass. There is no tenderness. There is no rebound and no guarding.  Musculoskeletal: Normal range of motion. He exhibits no edema.  Lymphadenopathy:    He has no cervical adenopathy.  Neurological: He is alert and oriented to person, place, and time. No cranial nerve deficit. Coordination normal.  Skin: Skin is warm and dry. No rash noted. He is not diaphoretic. No erythema. No pallor.  Psychiatric: He has a normal mood and affect. His behavior is normal. Judgment and thought content normal.          Assessment & Plan:   Problem List Items Addressed This Visit      Unprioritized   Diabetes type 2, controlled   Relevant Orders   Comprehensive metabolic panel   Hemoglobin A1c   Lipid panel   Hypertension   Relevant Orders   Microalbumin / creatinine urine ratio   Numbness and tingling in left hand    Reviewed previous MRI cervical spine from 2013. MRI showed degenerative changes in cervical spine and nerve compression. Recommended NSU evaluation given persistent symptoms, however he declines. Will follow.      Routine general medical examination at a health care facility - Primary    General medical exam normal today. Labs as ordered. Encouraged smoking cessation. Encouraged healthy diet and physical activity. Colonoscopy UTD and reviewed. Immunizations declined. PSA with labs today. Discussed limitations of PSA testing.      Relevant Orders   Vit D  25 hydroxy (rtn osteoporosis monitoring)   Tobacco abuse    Encouraged smoking cessation. Will request copy of last CT chest screening.          Return in about 3 months (around 10/19/2015) for Recheck of Diabetes.

## 2015-07-19 NOTE — Assessment & Plan Note (Signed)
Reviewed previous MRI cervical spine from 2013. MRI showed degenerative changes in cervical spine and nerve compression. Recommended NSU evaluation given persistent symptoms, however he declines. Will follow.

## 2015-07-19 NOTE — Patient Instructions (Signed)

## 2015-07-20 LAB — LDL CHOLESTEROL, DIRECT: Direct LDL: 180 mg/dL

## 2015-07-20 LAB — LIPID PANEL
CHOL/HDL RATIO: 7
Cholesterol: 267 mg/dL — ABNORMAL HIGH (ref 0–200)
HDL: 39.9 mg/dL (ref >39.00)
NONHDL: 227.23
Triglycerides: 352 mg/dL — ABNORMAL HIGH (ref 0.0–149.0)
VLDL: 70.4 mg/dL — AB (ref 0.0–40.0)

## 2015-07-20 LAB — COMPREHENSIVE METABOLIC PANEL
ALT: 16 U/L (ref 0–53)
AST: 17 U/L (ref 0–37)
Albumin: 4.2 g/dL (ref 3.5–5.2)
Alkaline Phosphatase: 61 U/L (ref 39–117)
BILIRUBIN TOTAL: 0.4 mg/dL (ref 0.2–1.2)
BUN: 26 mg/dL — ABNORMAL HIGH (ref 6–23)
CALCIUM: 9.9 mg/dL (ref 8.4–10.5)
CHLORIDE: 102 meq/L (ref 96–112)
CO2: 27 meq/L (ref 19–32)
Creatinine, Ser: 1.19 mg/dL (ref 0.40–1.50)
GFR: 78.41 mL/min (ref >60.00)
Glucose, Bld: 105 mg/dL — ABNORMAL HIGH (ref 70–99)
Potassium: 4.7 mEq/L (ref 3.5–5.1)
Sodium: 135 mEq/L (ref 135–145)
Total Protein: 6.8 g/dL (ref 6.0–8.3)

## 2015-07-20 LAB — MICROALBUMIN / CREATININE URINE RATIO
Creatinine,U: 178.7 mg/dL
MICROALB UR: 1.7 mg/dL (ref 0.0–1.9)
Microalb Creat Ratio: 1 mg/g (ref 0.0–30.0)

## 2015-07-20 LAB — HEMOGLOBIN A1C: Hgb A1c MFr Bld: 6.2 % (ref 4.6–6.5)

## 2015-07-20 LAB — VITAMIN D 25 HYDROXY (VIT D DEFICIENCY, FRACTURES): VITD: 85.67 ng/mL (ref 30.00–100.00)

## 2015-12-09 ENCOUNTER — Other Ambulatory Visit: Payer: Self-pay | Admitting: Internal Medicine

## 2015-12-09 ENCOUNTER — Telehealth: Payer: Self-pay | Admitting: *Deleted

## 2015-12-09 NOTE — Telephone Encounter (Signed)
Patient has requested a medication refill for amlodipine and lisinopril.

## 2015-12-09 NOTE — Telephone Encounter (Signed)
Refill has been sent as requested.

## 2016-01-07 ENCOUNTER — Other Ambulatory Visit: Payer: Self-pay

## 2016-01-07 MED ORDER — LISINOPRIL-HYDROCHLOROTHIAZIDE 20-25 MG PO TABS: 1.0000 | ORAL_TABLET | Freq: Every day | ORAL | Status: DC

## 2016-01-07 MED ORDER — AMLODIPINE BESYLATE 5 MG PO TABS: 5.0000 mg | ORAL_TABLET | Freq: Every day | ORAL | Status: DC

## 2016-03-27 ENCOUNTER — Telehealth: Payer: Self-pay | Admitting: *Deleted

## 2016-03-27 NOTE — Telephone Encounter (Signed)
Left a voicemail  message for patient notifying him that it is time to schedule the recommended follow up lung cancer screening low dose CT scan. Requested patient to call back to verify information prior to the scan being scheduled.

## 2016-04-17 ENCOUNTER — Telehealth: Payer: Self-pay | Admitting: *Deleted

## 2016-04-17 NOTE — Telephone Encounter (Signed)
Unable to successfully reach patient by phone. A letter has been sent informing patient it is time for their recommended follow up low dose CT scan for lung cancer screening.

## 2016-04-24 ENCOUNTER — Telehealth: Payer: Self-pay | Admitting: Internal Medicine

## 2016-04-24 NOTE — Telephone Encounter (Signed)
Pt wife called stating that her husband is coughing and throwing up and only wants to see Dr. Gilford Rile.. Please advise pt wife at [phone]

## 2016-04-24 NOTE — Telephone Encounter (Signed)
Tried calling patient wife to get more detail to see if this issue is something that can be address over the phone.  She said she would not speak to me about this and that she needed to speak to Dr Gilford Rile.  Offered patient an appointment but was not able to make that time slot.  Appointment made for when patient could come in.   Tried to address patients issue again but wife said she did not want to discuss it over the phone.

## 2016-05-10 DIAGNOSIS — Z0289 Encounter for other administrative examinations: Secondary | ICD-10-CM

## 2016-05-29 ENCOUNTER — Telehealth: Payer: Self-pay | Admitting: Internal Medicine

## 2016-05-29 NOTE — Telephone Encounter (Signed)
His wife told me that they have been trying to schedule follow up chest imaging with you? Would you mind touching base with him? Thanks so much!

## 2016-05-30 ENCOUNTER — Telehealth: Payer: Self-pay | Admitting: *Deleted

## 2016-05-30 ENCOUNTER — Encounter: Payer: Self-pay | Admitting: Family Medicine

## 2016-05-30 ENCOUNTER — Other Ambulatory Visit: Payer: Self-pay | Admitting: Family Medicine

## 2016-05-30 DIAGNOSIS — Z87891 Personal history of nicotine dependence: Secondary | ICD-10-CM

## 2016-05-30 HISTORY — DX: Personal history of nicotine dependence: Z87.891

## 2016-05-30 NOTE — Telephone Encounter (Signed)
After multiple attempts to contact patient and letter being mailed, was able to contact patient to Notify that it is time to have lung cancer screening scan done. Verified patient's age, no signs of lung cancer, no illness that would prevent patient from receiving treatment for lung cancer, and smoking history (current, 30.5 pack year history). Patient is expecting call from scheduling with appointment for screening scan and knows to call me with any questions. Shared decision making visit was done 02/24/15.

## 2016-06-13 ENCOUNTER — Ambulatory Visit: Payer: BLUE CROSS/BLUE SHIELD

## 2016-06-16 ENCOUNTER — Telehealth: Payer: Self-pay | Admitting: *Deleted

## 2016-06-16 ENCOUNTER — Encounter: Payer: Self-pay | Admitting: Internal Medicine

## 2016-06-16 ENCOUNTER — Ambulatory Visit (INDEPENDENT_AMBULATORY_CARE_PROVIDER_SITE_OTHER): Payer: BLUE CROSS/BLUE SHIELD

## 2016-06-16 ENCOUNTER — Ambulatory Visit (INDEPENDENT_AMBULATORY_CARE_PROVIDER_SITE_OTHER): Payer: BLUE CROSS/BLUE SHIELD | Admitting: Internal Medicine

## 2016-06-16 VITALS — BP 128/78 | HR 80 | Ht 71.0 in | Wt 152.8 lb

## 2016-06-16 DIAGNOSIS — R634 Abnormal weight loss: Secondary | ICD-10-CM

## 2016-06-16 DIAGNOSIS — Z125 Encounter for screening for malignant neoplasm of prostate: Secondary | ICD-10-CM

## 2016-06-16 DIAGNOSIS — E119 Type 2 diabetes mellitus without complications: Secondary | ICD-10-CM | POA: Diagnosis not present

## 2016-06-16 DIAGNOSIS — R112 Nausea with vomiting, unspecified: Secondary | ICD-10-CM

## 2016-06-16 DIAGNOSIS — E785 Hyperlipidemia, unspecified: Secondary | ICD-10-CM

## 2016-06-16 DIAGNOSIS — M25551 Pain in right hip: Secondary | ICD-10-CM

## 2016-06-16 DIAGNOSIS — I10 Essential (primary) hypertension: Secondary | ICD-10-CM | POA: Diagnosis not present

## 2016-06-16 LAB — CBC
HEMATOCRIT: 41.7 % (ref 38.5–50.0)
Hemoglobin: 14.1 g/dL (ref 13.2–17.1)
MCH: 31.8 pg (ref 27.0–33.0)
MCHC: 33.8 g/dL (ref 32.0–36.0)
MCV: 94.1 fL (ref 80.0–100.0)
MPV: 10.2 fL (ref 7.5–12.5)
Platelets: 283 10*3/uL (ref 140–400)
RBC: 4.43 MIL/uL (ref 4.20–5.80)
RDW: 13.8 % (ref 11.0–15.0)
WBC: 7.4 10*3/uL (ref 3.8–10.8)

## 2016-06-16 LAB — TSH: TSH: 0.82 mIU/L (ref 0.40–4.50)

## 2016-06-16 LAB — HEMOGLOBIN A1C
Hgb A1c MFr Bld: 6.2 % — ABNORMAL HIGH (ref <5.7)
MEAN PLASMA GLUCOSE: 131 mg/dL

## 2016-06-16 MED ORDER — TRAMADOL HCL 50 MG PO TABS: 50.0000 mg | ORAL_TABLET | Freq: Three times a day (TID) | ORAL | Status: DC | PRN

## 2016-06-16 NOTE — Progress Notes (Signed)
Subjective:    Patient ID: Michael Roy, male    DOB: January 18, 1946, 70 y.o.   MRN: [id number]  HPI  70YO male presents for follow up.  Right hip pain - Ongoing for a couple of months. Aching pain. Worse with activity. Taking wife's Tramadol with some improvement. No known injury. Worse with increased physical activity at work.  Nausea and vomiting - Occurs over last few months, especially when feeling hot. No abdominal pain. No persistent nausea. Appetite has been poor. No change in stools. No bloody or black stools.    Lab Results  Component Value Date   HGBA1C 6.2 07/19/2015     Wt Readings from Last 3 Encounters:  06/16/16 152 lb 12.8 oz (69.31 kg)  07/19/15 162 lb 2 oz (73.539 kg)  03/16/15 163 lb (73.936 kg)   BP Readings from Last 3 Encounters:  06/16/16 128/78  07/19/15 112/74  03/16/15 110/63    Past Medical History  Diagnosis Date  . Arthritis   . Eating disorder   . Headache(784.0)   . Heart murmur   . Hypertension   . Hyperlipidemia   . Cervical spine fracture ([place])   . Diabetes mellitus     borderline  . Cataract     just starting  . Dry cough     smokers cough per pt.   . Personal history of tobacco use, presenting hazards to health 05/30/2016   Family History  Problem Relation Age of Onset  . Hypertension Mother   . Diabetes Mother   . Heart disease Mother   . Hypertension Father   . Cancer Sister     colon  . Heart disease Sister   . Stroke Sister   . Colon cancer Sister   . Alcohol abuse Brother   . Cancer Brother   . Sudden death Brother   . Colon polyps Brother   . Colon polyps Brother   . Colon polyps Brother   . Rectal cancer Neg Hx   . Stomach cancer Neg Hx    Past Surgical History  Procedure Laterality Date  . Polypectomy  07-2012    18 polyps removed   . Colonoscopy  07-3012  . Stitches Left     calf area as a child    Social History   Social History  . Marital Status: Married    Spouse Name: N/A  . Number of  Children: N/A  . Years of Education: N/A   Social History Main Topics  . Smoking status: Current Every Day Smoker -- 1.00 packs/day    Types: Cigarettes  . Smokeless tobacco: Never Used  . Alcohol Use: 4.2 oz/week    7 Cans of beer per week     Comment: beer  . Drug Use: No  . Sexual Activity: Not on file   Other Topics Concern  . Not on file   Social History Narrative   Lives in [place]. 3 children.      Work - [employer]   Diet - healthy   Exercise - fishing    Review of Systems  Constitutional: Positive for appetite change. Negative for fever, chills, activity change, fatigue and unexpected weight change.  Eyes: Negative for visual disturbance.  Respiratory: Negative for cough and shortness of breath.   Cardiovascular: Negative for chest pain, palpitations and leg swelling.  Gastrointestinal: Positive for nausea and vomiting. Negative for abdominal pain, diarrhea, constipation, blood in stool and abdominal distention.  Genitourinary: Negative for dysuria, urgency, frequency, hematuria, flank  pain and difficulty urinating.  Musculoskeletal: Positive for arthralgias. Negative for gait problem.  Skin: Negative for color change and rash.  Neurological: Negative for weakness and headaches.  Hematological: Negative for adenopathy.  Psychiatric/Behavioral: Negative for sleep disturbance and dysphoric mood. The patient is not nervous/anxious.        Objective:    BP 128/78 mmHg  Pulse 80  Ht 5\' 11"  (1.803 m)  Wt 152 lb 12.8 oz (69.31 kg)  BMI 21.32 kg/m2  SpO2 94% Physical Exam  Constitutional: He is oriented to person, place, and time. He appears well-developed and well-nourished. No distress.  HENT:  Head: Normocephalic and atraumatic.  Right Ear: External ear normal.  Left Ear: External ear normal.  Nose: Nose normal.  Mouth/Throat: Oropharynx is clear and moist. No oropharyngeal exudate.  Eyes: Conjunctivae and EOM are normal. Pupils are equal, round,  and reactive to light. Right eye exhibits no discharge. Left eye exhibits no discharge. No scleral icterus.  Neck: Normal range of motion. Neck supple. No tracheal deviation present. No thyromegaly present.  Cardiovascular: Normal rate, regular rhythm and normal heart sounds.  Exam reveals no gallop and no friction rub.   No murmur heard. Pulmonary/Chest: Effort normal and breath sounds normal. No respiratory distress. He has no wheezes. He has no rales. He exhibits no tenderness.  Abdominal: Soft. Bowel sounds are normal. He exhibits no distension and no mass. There is tenderness (mild epigastric). There is no rebound and no guarding.  Musculoskeletal: Normal range of motion. He exhibits no edema.  Lymphadenopathy:    He has no cervical adenopathy.  Neurological: He is alert and oriented to person, place, and time. No cranial nerve deficit. Coordination normal.  Skin: Skin is warm and dry. No rash noted. He is not diaphoretic. No erythema. No pallor.  Psychiatric: He has a normal mood and affect. His behavior is normal. Judgment and thought content normal.          Assessment & Plan:   Problem List Items Addressed This Visit      Unprioritized   Diabetes type 2, controlled ([place]) - Primary (Chronic)    He does not check BG. Will check A1c with labs. Continue Metformin.      Relevant Orders   Comprehensive metabolic panel   Hemoglobin A1c   Hyperlipidemia (Chronic)    LFTs with labs today. Continue Atorvastatin.      Hypertension (Chronic)    BP Readings from Last 3 Encounters:  06/16/16 128/78  07/19/15 112/74  03/16/15 110/63   BP well controlled. Renal function with labs. Continue Lisinopril-HCTZ and amlodipine.      Loss of weight    Wt Readings from Last 3 Encounters:  06/16/16 152 lb 12.8 oz (69.31 kg)  07/19/15 162 lb 2 oz (73.539 kg)  03/16/15 163 lb (73.936 kg)   10lb weight loss. Poor appetite. N/V. Will check CMP, CBC, lipase. Discussed getting CT abdomen  for further evaluation. Also discussed GI evaluation with upper endoscopy. Will wait until labs back. Follow up in 1 week.      Nausea with vomiting    Recent NV. Exam remarkable for mild epigastric tenderness. Will check CMP, CBC, lipase with labs. Encouraged him to stop BC powder. Discussed additional testing including upper endoscopy and CT abdomen, but will wait until labs back.      Relevant Orders   CBC   TSH   Lipase   Right hip pain    Right hip pain. Exam today normal. Will  get plain xray for evaluation. Start Tramadol instead of NSAIDS given NV.      Relevant Orders   DG HIP UNILAT WITH PELVIS 2-3 VIEWS RIGHT    Other Visit Diagnoses    Screening for prostate cancer        Relevant Orders    PSA, Medicare        Return in about 1 week (around 06/23/2016) for Recheck.  Ronette Deter, MD Internal Medicine [facility]

## 2016-06-16 NOTE — Assessment & Plan Note (Signed)
Right hip pain. Exam today normal. Will get plain xray for evaluation. Start Tramadol instead of NSAIDS given NV.

## 2016-06-16 NOTE — Telephone Encounter (Signed)
Please advise, thanks.

## 2016-06-16 NOTE — Progress Notes (Signed)
Pre visit review using our clinic review tool, if applicable. No additional management support is needed unless otherwise documented below in the visit note. 

## 2016-06-16 NOTE — Assessment & Plan Note (Signed)
BP Readings from Last 3 Encounters:  06/16/16 128/78  07/19/15 112/74  03/16/15 110/63   BP well controlled. Renal function with labs. Continue Lisinopril-HCTZ and amlodipine.

## 2016-06-16 NOTE — Assessment & Plan Note (Signed)
Recent NV. Exam remarkable for mild epigastric tenderness. Will check CMP, CBC, lipase with labs. Encouraged him to stop BC powder. Discussed additional testing including upper endoscopy and CT abdomen, but will wait until labs back.

## 2016-06-16 NOTE — Assessment & Plan Note (Signed)
Wt Readings from Last 3 Encounters:  06/16/16 152 lb 12.8 oz (69.31 kg)  07/19/15 162 lb 2 oz (73.539 kg)  03/16/15 163 lb (73.936 kg)   10lb weight loss. Poor appetite. N/V. Will check CMP, CBC, lipase. Discussed getting CT abdomen for further evaluation. Also discussed GI evaluation with upper endoscopy. Will wait until labs back. Follow up in 1 week.

## 2016-06-16 NOTE — Telephone Encounter (Signed)
Call and advise of a 1pm appt next week, thanks

## 2016-06-16 NOTE — Assessment & Plan Note (Signed)
LFTs with labs today. Continue Atorvastatin.

## 2016-06-16 NOTE — Telephone Encounter (Signed)
Please advise a 30 min follow up in 1 week on dr. Gilford Rile schedule  Pt contact [phone]

## 2016-06-16 NOTE — Patient Instructions (Signed)
Labs today.  Xray of right hip today.  Start Tramadol as needed for hip pain.  Follow up 1 weeks.

## 2016-06-16 NOTE — Assessment & Plan Note (Signed)
He does not check BG. Will check A1c with labs. Continue Metformin.

## 2016-06-16 NOTE — Telephone Encounter (Signed)
Fine to use any 1pm slot

## 2016-06-17 LAB — COMPREHENSIVE METABOLIC PANEL
ALBUMIN: 4.1 g/dL (ref 3.6–5.1)
ALT: 23 U/L (ref 9–46)
AST: 18 U/L (ref 10–35)
Alkaline Phosphatase: 63 U/L (ref 40–115)
BILIRUBIN TOTAL: 0.4 mg/dL (ref 0.2–1.2)
BUN: 21 mg/dL (ref 7–25)
CO2: 22 mmol/L (ref 20–31)
CREATININE: 1.39 mg/dL — AB (ref 0.70–1.25)
Calcium: 9.2 mg/dL (ref 8.6–10.3)
Chloride: 100 mmol/L (ref 98–110)
Glucose, Bld: 92 mg/dL (ref 65–99)
Potassium: 5.1 mmol/L (ref 3.5–5.3)
SODIUM: 135 mmol/L (ref 135–146)
TOTAL PROTEIN: 6.8 g/dL (ref 6.1–8.1)

## 2016-06-17 LAB — LIPASE: LIPASE: 30 U/L (ref 7–60)

## 2016-06-17 LAB — PSA, MEDICARE: PSA: 2.8 ng/mL (ref <=4.00)

## 2016-06-19 ENCOUNTER — Other Ambulatory Visit: Payer: Self-pay | Admitting: Internal Medicine

## 2016-06-19 DIAGNOSIS — R112 Nausea with vomiting, unspecified: Secondary | ICD-10-CM

## 2016-06-19 NOTE — Telephone Encounter (Signed)
LVM to call office to schedule follow up appt.

## 2016-06-20 NOTE — Telephone Encounter (Signed)
scheduled

## 2016-06-23 ENCOUNTER — Ambulatory Visit (INDEPENDENT_AMBULATORY_CARE_PROVIDER_SITE_OTHER): Payer: BLUE CROSS/BLUE SHIELD | Admitting: Internal Medicine

## 2016-06-23 ENCOUNTER — Encounter: Payer: Self-pay | Admitting: Internal Medicine

## 2016-06-23 VITALS — BP 128/78 | HR 75 | Ht 71.5 in | Wt 152.0 lb

## 2016-06-23 DIAGNOSIS — R634 Abnormal weight loss: Secondary | ICD-10-CM

## 2016-06-23 DIAGNOSIS — R112 Nausea with vomiting, unspecified: Secondary | ICD-10-CM | POA: Diagnosis not present

## 2016-06-23 NOTE — Assessment & Plan Note (Signed)
Persistent unintentional weight loss. Labs were normal except for mild increase in Cr. Will set up CT abd and pel for further evaluation as well as CT chest for lung cancer screening.

## 2016-06-23 NOTE — Progress Notes (Signed)
Subjective:    Patient ID: Michael Roy, male    DOB: Oct 23, 1946, 70 y.o.   MRN: [id number]  HPI  70YO male presents for follow up.  Recently seen for follow up. Noted to have NV and weight loss. Referral placed to GI for follow up evaluation.  No nausea since last visit. Appetite is so-so, but unchanged. No abdominal pain. Continues to lose weight. Has occasional cough. No dyspnea. No urinary symptoms.   Wt Readings from Last 3 Encounters:  06/23/16 152 lb (68.947 kg)  06/16/16 152 lb 12.8 oz (69.31 kg)  07/19/15 162 lb 2 oz (73.539 kg)   BP Readings from Last 3 Encounters:  06/23/16 128/78  06/16/16 128/78  07/19/15 112/74    Past Medical History  Diagnosis Date  . Arthritis   . Eating disorder   . Headache(784.0)   . Heart murmur   . Hypertension   . Hyperlipidemia   . Cervical spine fracture ([place])   . Diabetes mellitus     borderline  . Cataract     just starting  . Dry cough     smokers cough per pt.   . Personal history of tobacco use, presenting hazards to health 05/30/2016   Family History  Problem Relation Age of Onset  . Hypertension Mother   . Diabetes Mother   . Heart disease Mother   . Hypertension Father   . Cancer Sister     colon  . Heart disease Sister   . Stroke Sister   . Colon cancer Sister   . Alcohol abuse Brother   . Cancer Brother   . Sudden death Brother   . Colon polyps Brother   . Colon polyps Brother   . Colon polyps Brother   . Rectal cancer Neg Hx   . Stomach cancer Neg Hx    Past Surgical History  Procedure Laterality Date  . Polypectomy  07-2012    18 polyps removed   . Colonoscopy  07-3012  . Stitches Left     calf area as a child    Social History   Social History  . Marital Status: Married    Spouse Name: N/A  . Number of Children: N/A  . Years of Education: N/A   Social History Main Topics  . Smoking status: Current Every Day Smoker -- 1.00 packs/day    Types: Cigarettes  . Smokeless tobacco: Never  Used  . Alcohol Use: 4.2 oz/week    7 Cans of beer per week     Comment: beer  . Drug Use: No  . Sexual Activity: Not Asked   Other Topics Concern  . None   Social History Narrative   Lives in [place]. 3 children.      Work - [employer]   Diet - healthy   Exercise - fishing    Review of Systems  Constitutional: Negative for fever, chills, activity change, appetite change, fatigue and unexpected weight change.  Eyes: Negative for visual disturbance.  Respiratory: Positive for cough. Negative for shortness of breath.   Cardiovascular: Negative for chest pain, palpitations and leg swelling.  Gastrointestinal: Positive for nausea and vomiting. Negative for abdominal pain, diarrhea, constipation and abdominal distention.  Genitourinary: Negative for dysuria, urgency and difficulty urinating.  Musculoskeletal: Negative for arthralgias and gait problem.  Skin: Negative for color change and rash.  Hematological: Negative for adenopathy.  Psychiatric/Behavioral: Negative for sleep disturbance and dysphoric mood. The patient is not nervous/anxious.  Objective:    BP 128/78 mmHg  Pulse 75  Ht 5' 11.5" (1.816 m)  Wt 152 lb (68.947 kg)  BMI 20.91 kg/m2  SpO2 96% Physical Exam  Constitutional: He is oriented to person, place, and time. He appears well-developed and well-nourished. No distress.  HENT:  Head: Normocephalic and atraumatic.  Right Ear: External ear normal.  Left Ear: External ear normal.  Nose: Nose normal.  Mouth/Throat: Oropharynx is clear and moist. No oropharyngeal exudate.  Eyes: Conjunctivae and EOM are normal. Pupils are equal, round, and reactive to light. Right eye exhibits no discharge. Left eye exhibits no discharge. No scleral icterus.  Neck: Normal range of motion. Neck supple. No tracheal deviation present. No thyromegaly present.  Cardiovascular: Normal rate, regular rhythm and normal heart sounds.  Exam reveals no gallop and no friction  rub.   No murmur heard. Pulmonary/Chest: Effort normal and breath sounds normal. No respiratory distress. He has no wheezes. He has no rales. He exhibits no tenderness.  Abdominal: Soft. Bowel sounds are normal. He exhibits no distension and no mass. There is no tenderness. There is no rebound and no guarding.  Musculoskeletal: Normal range of motion. He exhibits no edema.  Lymphadenopathy:    He has no cervical adenopathy.  Neurological: He is alert and oriented to person, place, and time. No cranial nerve deficit. Coordination normal.  Skin: Skin is warm and dry. No rash noted. He is not diaphoretic. No erythema. No pallor.  Psychiatric: He has a normal mood and affect. His behavior is normal. Judgment and thought content normal.          Assessment & Plan:   Problem List Items Addressed This Visit      Unprioritized   Loss of weight    Persistent unintentional weight loss. Labs were normal except for mild increase in Cr. Will set up CT abd and pel for further evaluation as well as CT chest for lung cancer screening.      Nausea with vomiting - Primary    Symptoms improved slightly over last week. Labs reviewed with pt. Given persistent weight loss, will set up CT abd for further evaluation.      Relevant Orders   CT Abdomen Pelvis W Contrast       Return in about 1 week (around 06/30/2016) for Recheck.  Ronette Deter, MD Internal Medicine [facility]

## 2016-06-23 NOTE — Assessment & Plan Note (Signed)
Symptoms improved slightly over last week. Labs reviewed with pt. Given persistent weight loss, will set up CT abd for further evaluation.

## 2016-06-23 NOTE — Progress Notes (Signed)
Pre visit review using our clinic review tool, if applicable. No additional management support is needed unless otherwise documented below in the visit note. 

## 2016-06-23 NOTE — Patient Instructions (Signed)
We will set up CT of the abdomen and pelvis as well as CT chest.

## 2016-07-04 ENCOUNTER — Ambulatory Visit: Payer: Self-pay | Admitting: Internal Medicine

## 2016-07-05 ENCOUNTER — Other Ambulatory Visit: Payer: Self-pay | Admitting: *Deleted

## 2016-07-05 DIAGNOSIS — Z87891 Personal history of nicotine dependence: Secondary | ICD-10-CM

## 2016-07-06 ENCOUNTER — Telehealth: Payer: Self-pay | Admitting: *Deleted

## 2016-07-06 NOTE — Telephone Encounter (Signed)
error 

## 2016-07-07 ENCOUNTER — Ambulatory Visit
Admission: RE | Admit: 2016-07-07 | Discharge: 2016-07-07 | Disposition: A | Discharge status: Home / Self Care | Payer: BLUE CROSS/BLUE SHIELD | Source: Ambulatory Visit | Attending: Family Medicine | Admitting: Family Medicine

## 2016-07-07 ENCOUNTER — Telehealth: Payer: Self-pay | Admitting: Internal Medicine

## 2016-07-07 ENCOUNTER — Ambulatory Visit
Admission: RE | Admit: 2016-07-07 | Discharge: 2016-07-07 | Disposition: A | Discharge status: Home / Self Care | Payer: BLUE CROSS/BLUE SHIELD | Source: Ambulatory Visit | Attending: Internal Medicine | Admitting: Internal Medicine

## 2016-07-07 ENCOUNTER — Ambulatory Visit: Admission: RE | Admit: 2016-07-07 | Payer: BLUE CROSS/BLUE SHIELD | Source: Ambulatory Visit

## 2016-07-07 DIAGNOSIS — F17200 Nicotine dependence, unspecified, uncomplicated: Secondary | ICD-10-CM | POA: Insufficient documentation

## 2016-07-07 DIAGNOSIS — K760 Fatty (change of) liver, not elsewhere classified: Secondary | ICD-10-CM | POA: Diagnosis not present

## 2016-07-07 DIAGNOSIS — Z122 Encounter for screening for malignant neoplasm of respiratory organs: Secondary | ICD-10-CM | POA: Diagnosis present

## 2016-07-07 DIAGNOSIS — Z87891 Personal history of nicotine dependence: Secondary | ICD-10-CM | POA: Insufficient documentation

## 2016-07-07 DIAGNOSIS — I700 Atherosclerosis of aorta: Secondary | ICD-10-CM | POA: Diagnosis not present

## 2016-07-07 DIAGNOSIS — K8689 Other specified diseases of pancreas: Secondary | ICD-10-CM | POA: Insufficient documentation

## 2016-07-07 DIAGNOSIS — K5730 Diverticulosis of large intestine without perforation or abscess without bleeding: Secondary | ICD-10-CM | POA: Diagnosis not present

## 2016-07-07 DIAGNOSIS — R112 Nausea with vomiting, unspecified: Secondary | ICD-10-CM | POA: Diagnosis not present

## 2016-07-07 MED ORDER — IOPAMIDOL (ISOVUE-300) INJECTION 61%: 100.0000 mL | Freq: Once | INTRAVENOUS | Status: DC | PRN

## 2016-07-07 NOTE — Telephone Encounter (Signed)
Pt called wanting a copy of his imaging results from today. Give to wife she's here at appt. Thank you!

## 2016-07-07 NOTE — Telephone Encounter (Signed)
Results printed, put in folder for pick up

## 2016-07-11 ENCOUNTER — Telehealth: Payer: Self-pay | Admitting: *Deleted

## 2016-07-11 NOTE — Telephone Encounter (Signed)
Attempted to notified patient of LDCT lung cancer screening results with recommendation for 12 month follow up as well as incidental findings. Voicemail left for patient to call me back if need to review results. Patient may have obtained results from PCP already.

## 2016-07-13 ENCOUNTER — Encounter: Payer: Self-pay | Admitting: *Deleted

## 2016-07-25 ENCOUNTER — Encounter: Payer: Self-pay | Admitting: Internal Medicine

## 2016-07-25 ENCOUNTER — Ambulatory Visit (INDEPENDENT_AMBULATORY_CARE_PROVIDER_SITE_OTHER): Payer: BLUE CROSS/BLUE SHIELD | Admitting: Internal Medicine

## 2016-07-25 ENCOUNTER — Other Ambulatory Visit (INDEPENDENT_AMBULATORY_CARE_PROVIDER_SITE_OTHER): Payer: BLUE CROSS/BLUE SHIELD

## 2016-07-25 VITALS — BP 110/70 | HR 72 | Ht 69.0 in | Wt 149.1 lb

## 2016-07-25 DIAGNOSIS — R1013 Epigastric pain: Secondary | ICD-10-CM

## 2016-07-25 DIAGNOSIS — R634 Abnormal weight loss: Secondary | ICD-10-CM

## 2016-07-25 DIAGNOSIS — R6881 Early satiety: Secondary | ICD-10-CM

## 2016-07-25 DIAGNOSIS — R932 Abnormal findings on diagnostic imaging of liver and biliary tract: Secondary | ICD-10-CM | POA: Diagnosis not present

## 2016-07-25 DIAGNOSIS — R112 Nausea with vomiting, unspecified: Secondary | ICD-10-CM

## 2016-07-25 LAB — CREATININE, SERUM: Creatinine, Ser: 1.14 mg/dL (ref 0.40–1.50)

## 2016-07-25 LAB — BUN: BUN: 22 mg/dL (ref 6–23)

## 2016-07-25 NOTE — Patient Instructions (Addendum)
You have been scheduled for an MRI/MRCP at [facility] on Monday , 07/31/16. Your appointment time is 11:45 am. Please arrive 15 minutes prior to your appointment time for registration purposes. Please make certain not to have anything to eat or drink 6 hours prior to your test. In addition, if you have any metal in your body, have a pacemaker or defibrillator, please be sure to let your ordering physician know. This test typically takes 45 minutes to 1 hour to complete.  Your physician has requested that you go to the basement for the following lab work before leaving today: BUN, Creatinine  You have been scheduled for an endoscopy. Please follow written instructions given to you at your visit today. If you use inhalers (even only as needed), please bring them with you on the day of your procedure. Your physician has requested that you go to www.startemmi.com and enter the access code given to you at your visit today. This web site gives a general overview about your procedure. However, you should still follow specific instructions given to you by our office regarding your preparation for the procedure.  If you are age 24 or older, your body mass index should be between 23-30. Your Body mass index is 22.02 kg/m. If this is out of the aforementioned range listed, please consider follow up with your Primary Care Provider.  If you are age 23 or younger, your body mass index should be between 19-25. Your Body mass index is 22.02 kg/m. If this is out of the aformentioned range listed, please consider follow up with your Primary Care Provider.

## 2016-07-25 NOTE — Progress Notes (Signed)
Patient ID: Michael Roy, male   DOB: 1946/05/22, 70 y.o.   MRN: [id number] HPI: Michael Roy is a 70 year old male with a past medical history of multiple adenomatous colon polyps, colonic diverticulosis, long-standing tobacco use, hypertension, hyperlipidemia, borderline diabetes who is seen in consultation at the request of Dr. Gilford Rile to evaluate weight loss and episodic epigastric pain with nausea and vomiting. He is here today with his wife. He reports in the last several months she's had 2 episodes of nausea and vomiting. This happened while working in the evening. He ate lunch normally that day but developed severe nausea and vomiting. The episodes resolved spontaneously each time. Separate from this he's noticed occasional epigastric abdominal pain which seems to be worse in the morning. He gets better with eating and drinking fluids specifically milk. He has intermittent indigestion/epigastric pain throughout the day but denies true heartburn. He denies dysphagia and odynophagia. His bowel movements are once to twice per day without blood or melena. He's noticed a decreasing appetite and a weight loss of about 20 pounds. He also feels full quickly. He drinks alcohol approximately one beer per day but has never been a heavy drinker.  Family history notable for colon cancer in a sister. Brother had alcoholic cirrhosis.  I performed 2 colonoscopies revealing multiple adenomatous colon polyps. The last was 03/16/2015. 8, subcentimeter, polyps were removed found to be tubular adenoma and polypoid colonic folds. There was moderate diverticulosis in the ascending and left: The small angiodysplastic lesion in the cecum. Internal hemorrhoids were seen with retroflexion. Three-year recall colonoscopy was recommended  Dr. Gilford Rile ordered a CT scan of the abdomen and pelvis with contrast which performed on 07/07/2016. I have reviewed the study. It showed no acute abnormality though mild diffuse hepatic  steatosis. Mild dilatation of the common hepatic duct and intrahepatic ducts. There was colonic diverticulosis. Aortic atherosclerosis and atrophy in the neck body and tail of the pancreas. No pancreatic mass was seen  Past Medical History:  Diagnosis Date  . Aortic atherosclerosis ([place])   . Arthritis   . Cataract    just starting  . Cervical spine fracture ([place])   . Diabetes mellitus    borderline  . Diverticulosis   . Dry cough    smokers cough per pt.   . Eating disorder   . Headache(784.0)   . Heart murmur   . Hepatic steatosis   . Hyperlipidemia   . Hypertension   . Personal history of tobacco use, presenting hazards to health 05/30/2016  . Tubular adenoma of colon     Past Surgical History:  Procedure Laterality Date  . COLONOSCOPY  07-3012  . POLYPECTOMY  07-2012   18 polyps removed   . stitches Left    calf area as a child     Outpatient Medications Prior to Visit  Medication Sig Dispense Refill  . amLODipine (NORVASC) 5 MG tablet Take 1 tablet (5 mg total) by mouth daily. 90 tablet 2  . BAYER CONTOUR NEXT TEST test strip USE AS DIRECTED 100 each 6  . lisinopril-hydrochlorothiazide (PRINZIDE,ZESTORETIC) 20-25 MG tablet Take 1 tablet by mouth daily. 90 tablet 2  . metFORMIN (GLUCOPHAGE) 500 MG tablet TAKE ONE TABLET BY MOUTH TWICE DAILY WITH MEALS 180 tablet 6  . MICROLET LANCETS MISC USE 1 LANCET TWICE DAILY 100 each 6  . traMADol (ULTRAM) 50 MG tablet Take 1 tablet (50 mg total) by mouth every 8 (eight) hours as needed. 90 tablet 1  . atorvastatin (LIPITOR) 40  MG tablet Take 1 tablet (40 mg total) by mouth daily. 30 tablet 11   No facility-administered medications prior to visit.     No Known Allergies  Family History  Problem Relation Age of Onset  . Hypertension Mother   . Diabetes Mother   . Heart disease Mother   . Hypertension Father   . Cancer Sister     colon  . Heart disease Sister   . Stroke Sister   . Colon cancer Sister   . Alcohol abuse  Brother   . Cancer Brother   . Sudden death Brother   . Colon polyps Brother   . Colon polyps Brother   . Colon polyps Brother   . Rectal cancer Neg Hx   . Stomach cancer Neg Hx     Social History  Substance Use Topics  . Smoking status: Current Every Day Smoker    Packs/day: 1.00    Types: Cigarettes  . Smokeless tobacco: Never Used  . Alcohol use 4.2 oz/week    7 Cans of beer per week     Comment: beer    ROS: As per history of present illness, otherwise negative  BP 110/70 (BP Location: Left Arm, Patient Position: Sitting, Cuff Size: Normal)   Pulse 72   Ht 5\' 9"  (1.753 m) Comment: height measured without shoes  Wt 149 lb 2 oz (67.6 kg)   BMI 22.02 kg/m  Constitutional: Well-developed and well-nourished. No distress. HEENT: Normocephalic and atraumatic. Oropharynx is clear and moist. No oropharyngeal exudate. Conjunctivae are normal.  No scleral icterus. Neck: Neck supple. Trachea midline. Cardiovascular: Normal rate, regular rhythm and intact distal pulses. No M/R/G Pulmonary/chest: Effort normal and breath sounds normal. No wheezing, rales or rhonchi. Abdominal: Soft, Epigastric tenderness without rebound or guarding, nondistended. Bowel sounds active throughout. There are no masses palpable. No hepatosplenomegaly. Extremities: no clubbing, cyanosis, or edema Lymphadenopathy: No cervical adenopathy noted. Neurological: Alert and oriented to person place and time. Skin: Skin is warm and dry. No rashes noted. Psychiatric: Normal mood and affect. Behavior is normal.  RELEVANT LABS AND IMAGING: CBC    Component Value Date/Time   WBC 7.4 06/16/2016 1544   RBC 4.43 06/16/2016 1544   HGB 14.1 06/16/2016 1544   HCT 41.7 06/16/2016 1544   PLT 283 06/16/2016 1544   MCV 94.1 06/16/2016 1544   MCH 31.8 06/16/2016 1544   MCHC 33.8 06/16/2016 1544   RDW 13.8 06/16/2016 1544    CMP     Component Value Date/Time   NA 135 06/16/2016 1544   K 5.1 06/16/2016 1544   CL  100 06/16/2016 1544   CO2 22 06/16/2016 1544   GLUCOSE 92 06/16/2016 1544   BUN 22 07/25/2016 1200   CREATININE 1.14 07/25/2016 1200   CREATININE 1.39 (H) 06/16/2016 1544   CALCIUM 9.2 06/16/2016 1544   PROT 6.8 06/16/2016 1544   ALBUMIN 4.1 06/16/2016 1544   AST 18 06/16/2016 1544   ALT 23 06/16/2016 1544   ALKPHOS 63 06/16/2016 1544   BILITOT 0.4 06/16/2016 1544   CLINICAL DATA:  Loss of appetite. Two episodes of severe nausea and vomiting in the past month.   EXAM: CT ABDOMEN AND PELVIS WITH CONTRAST   TECHNIQUE: Multidetector CT imaging of the abdomen and pelvis was performed using the standard protocol following bolus administration of intravenous contrast.   CONTRAST:  100 cc Isovue-300   COMPARISON:  None.   FINDINGS: Lower chest:  Minimal dependent atelectasis at both lung bases.  Hepatobiliary: Mild diffuse low density of the liver relative to the spleen. Mildly dilated common hepatic duct and intrahepatic ducts. The common hepatic duct measures 10.6 mm in maximum diameter. The common bile duct is normal in caliber. No obstructing stone or mass seen. Normal appearing gallbladder.   Pancreas: Atrophy of the neck, body and tail of the pancreas. The head of the pancreas has a normal appearance. No mass visualized.   Spleen: Within normal limits in size and appearance.   Adrenals/Urinary Tract: Normal appearing adrenal glands. Multiple bilateral renal cysts. No urinary tract calculi or hydronephrosis. Normal appearing urinary bladder.   Stomach/Bowel: Multiple colonic diverticula without evidence of diverticulitis. No gastric or small bowel abnormalities. Normal appearing appendix.   Vascular/Lymphatic: Atheromatous arterial calcifications, including the abdominal aorta. No enlarged lymph nodes.   Reproductive: Mildly enlarged prostate gland.   Other: None.   Musculoskeletal: Lumbar and lower thoracic spine degenerative changes.   IMPRESSION: 1. No  acute abnormality. 2. Mild diffuse hepatic steatosis. 3. Mild dilatation of the common hepatic duct and intrahepatic ducts. This could be due to a nonvisualized common duct stone or stricture. This is of doubtful clinical significance in view normal liver function tests on 06/16/2016. 4. Colonic diverticulosis. 5. Aortic atherosclerosis. 6. Atrophy of the neck, body and tail of the pancreas. This is of unknown significance in the absence of a visualized pancreatic mass.     Electronically Signed   By: Claudie Revering M.D.   On: 07/07/2016 08:43   Low dose CT Chest -- benign appearance, repeat annual low-dose screening  ASSESSMENT/PLAN:  70 year old male with a past medical history of multiple adenomatous colon polyps, colonic diverticulosis, long-standing tobacco use, hypertension, hyperlipidemia, borderline diabetes who is seen in consultation at the request of Dr. Gilford Rile to evaluate weight loss and episodic epigastric pain with nausea and vomiting.  1. Epigastric pain/weight loss/early satiety/episodes of nausea with vomiting/biliary ductal dilatation and pancreatic atrophy -- repeat liver enzymes today. I recommended upper endoscopy to evaluate symptoms of early satiety, abdominal pain and weight loss. We discussed the risks, benefits and alternatives and he wishes to proceed. I would like to further evaluate the biliary ductal dilatation and pancreatic atrophy with MRI abdomen with MRCP. Rule out CBD stone/mass lesion, rule out pancreatic mass. Repeat CMP today.  2. History of multiple colon polyps -- repeat colonoscopy April 2019      [id number] Gilford Rile, Md No address on file

## 2016-07-27 ENCOUNTER — Ambulatory Visit: Payer: BLUE CROSS/BLUE SHIELD

## 2016-07-28 ENCOUNTER — Ambulatory Visit (HOSPITAL_COMMUNITY): Payer: BLUE CROSS/BLUE SHIELD

## 2016-07-28 ENCOUNTER — Encounter: Payer: Self-pay | Admitting: Internal Medicine

## 2016-07-31 ENCOUNTER — Ambulatory Visit (HOSPITAL_COMMUNITY)
Admission: RE | Admit: 2016-07-31 | Discharge: 2016-07-31 | Disposition: A | Discharge status: Home / Self Care | Payer: BLUE CROSS/BLUE SHIELD | Source: Ambulatory Visit | Attending: Internal Medicine | Admitting: Internal Medicine

## 2016-07-31 ENCOUNTER — Other Ambulatory Visit: Payer: Self-pay | Admitting: Internal Medicine

## 2016-07-31 DIAGNOSIS — R6881 Early satiety: Secondary | ICD-10-CM

## 2016-07-31 DIAGNOSIS — M47816 Spondylosis without myelopathy or radiculopathy, lumbar region: Secondary | ICD-10-CM | POA: Insufficient documentation

## 2016-07-31 DIAGNOSIS — R935 Abnormal findings on diagnostic imaging of other abdominal regions, including retroperitoneum: Secondary | ICD-10-CM | POA: Insufficient documentation

## 2016-07-31 DIAGNOSIS — R932 Abnormal findings on diagnostic imaging of liver and biliary tract: Secondary | ICD-10-CM | POA: Insufficient documentation

## 2016-07-31 DIAGNOSIS — N281 Cyst of kidney, acquired: Secondary | ICD-10-CM | POA: Insufficient documentation

## 2016-07-31 DIAGNOSIS — M5137 Other intervertebral disc degeneration, lumbosacral region: Secondary | ICD-10-CM | POA: Insufficient documentation

## 2016-07-31 DIAGNOSIS — R634 Abnormal weight loss: Secondary | ICD-10-CM

## 2016-07-31 DIAGNOSIS — M5136 Other intervertebral disc degeneration, lumbar region: Secondary | ICD-10-CM | POA: Insufficient documentation

## 2016-07-31 MED ORDER — GADOBENATE DIMEGLUMINE 529 MG/ML IV SOLN
15.0000 mL | Freq: Once | INTRAVENOUS | Status: AC | PRN
  Administered 2016-07-31: 14 mL via INTRAVENOUS

## 2016-08-02 ENCOUNTER — Ambulatory Visit: Payer: Self-pay | Admitting: Family Medicine

## 2016-08-03 ENCOUNTER — Ambulatory Visit (HOSPITAL_COMMUNITY): Payer: BLUE CROSS/BLUE SHIELD

## 2016-08-09 ENCOUNTER — Telehealth: Payer: Self-pay | Admitting: Gastroenterology

## 2016-08-09 NOTE — Telephone Encounter (Signed)
Michael Roy, His case was discussed at multidisciplinary GI cancer conference this morning.  Consensus was that you proceed with EGD as planned.  If no clear cause for his weight loss, underlying symptoms then he should be referred to tertiary biliary surgeon (Dr. Barry Dienes suggested Dr. Cloyd Stagers at [facility]; I'm sure Dr Dossie Der at [facility] would have experience in this as well).  Thanks

## 2016-08-10 ENCOUNTER — Encounter: Payer: Self-pay | Admitting: Internal Medicine

## 2016-08-10 ENCOUNTER — Ambulatory Visit (AMBULATORY_SURGERY_CENTER): Payer: BLUE CROSS/BLUE SHIELD | Admitting: Internal Medicine

## 2016-08-10 VITALS — BP 107/72 | HR 57 | Temp 97.8°F | Resp 16 | Ht 69.0 in | Wt 149.0 lb

## 2016-08-10 DIAGNOSIS — R933 Abnormal findings on diagnostic imaging of other parts of digestive tract: Secondary | ICD-10-CM | POA: Diagnosis not present

## 2016-08-10 DIAGNOSIS — K2970 Gastritis, unspecified, without bleeding: Secondary | ICD-10-CM

## 2016-08-10 DIAGNOSIS — R634 Abnormal weight loss: Secondary | ICD-10-CM | POA: Diagnosis not present

## 2016-08-10 DIAGNOSIS — K319 Disease of stomach and duodenum, unspecified: Secondary | ICD-10-CM | POA: Diagnosis not present

## 2016-08-10 DIAGNOSIS — B9681 Helicobacter pylori [H. pylori] as the cause of diseases classified elsewhere: Secondary | ICD-10-CM

## 2016-08-10 MED ORDER — OMEPRAZOLE 40 MG PO CPDR: 40.0000 mg | DELAYED_RELEASE_CAPSULE | Freq: Two times a day (BID) | ORAL | 3 refills | Status: DC

## 2016-08-10 MED ORDER — SODIUM CHLORIDE 0.9 % IV SOLN: 500.0000 mL | INTRAVENOUS | Status: AC

## 2016-08-10 NOTE — Progress Notes (Signed)
To pacu vss patent aw reprot to rn 

## 2016-08-10 NOTE — Op Note (Signed)
[place] Patient Name: Michael Roy Procedure Date: 08/10/2016 9:22 AM MRN: [id number] Endoscopist: Jerene Bears , MD Age: 70 Referring MD:  Date of Birth: Mar 29, 1946 Gender: Male Account #: [id number] Procedure:                Upper GI endoscopy Indications:              Epigastric abdominal pain, Abnormal MRI of the GI                            tract, Early satiety, Weight loss Medicines:                Monitored Anesthesia Care Procedure:                Pre-Anesthesia Assessment:                           - Prior to the procedure, a History and Physical                            was performed, and patient medications and                            allergies were reviewed. The patient's tolerance of                            previous anesthesia was also reviewed. The risks                            and benefits of the procedure and the sedation                            options and risks were discussed with the patient.                            All questions were answered, and informed consent                            was obtained. Prior Anticoagulants: The patient has                            taken no previous anticoagulant or antiplatelet                            agents. ASA Grade Assessment: III - A patient with                            severe systemic disease. After reviewing the risks                            and benefits, the patient was deemed in                            satisfactory condition to undergo the procedure.  After obtaining informed consent, the endoscope was                            passed under direct vision. Throughout the                            procedure, the patient's blood pressure, pulse, and                            oxygen saturations were monitored continuously. The                            Model GIF-[id number]) scope was introduced                            through the mouth,  and advanced to the second part                            of duodenum. The upper GI endoscopy was                            accomplished without difficulty. The patient                            tolerated the procedure well. Scope In: Scope Out: Findings:                 LA Grade A (one or more mucosal breaks less than 5                            mm, not extending between tops of 2 mucosal folds)                            esophagitis was found at the gastroesophageal                            junction.                           A 3 cm hiatal hernia was present (36-39 cm).                           A widely patent Schatzki ring (acquired) was found                            at the gastroesophageal junction.                           Diffuse moderate inflammation characterized by                            congestion (edema), erythema and granularity was                            found in the gastric body, in  the gastric antrum                            and in the prepyloric region of the stomach.                            Biopsies were taken with a cold forceps for                            histology and Helicobacter pylori testing.                           Several non-bleeding, 1 cratered and 2 linear,                            duodenal ulcers with no stigmata of bleeding were                            found in the duodenal bulb. The largest lesion was                            10 mm in largest dimension.                           The second portion of the duodenum was normal. Complications:            No immediate complications. Estimated Blood Loss:     Estimated blood loss was minimal. Impression:               - LA Grade A reflux esophagitis.                           - 3 cm hiatal hernia.                           - Widely patent Schatzki ring.                           - Gastritis. Biopsied.                           - Multiple non-bleeding duodenal ulcers with no                             stigmata of bleeding.                           - Normal second portion of the duodenum. Recommendation:           - Patient has a contact number available for                            emergencies. The signs and symptoms of potential                            delayed complications were discussed with the  patient. Return to normal activities tomorrow.                            Written discharge instructions were provided to the                            patient.                           - Resume previous diet. Begin omeprazole 40 mg                            twice daily (30 minutes before breakfast and                            dinner).                           - Continue present medications.                           - Avoid aspirin, ibuprofen, naproxen, or other                            non-steroidal anti-inflammatory drugs given peptic                            ulcer disease.                           - Await pathology results, treat H. Pylori if                            positive.                           - While gastritis and duodenal ulcer disease likely                            explains GI symptoms, referral to [facility] for                            consultation with Dr. 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 Jerene Bears, MD 08/10/2016 9:43:55 AM This report has been signed electronically.

## 2016-08-10 NOTE — Progress Notes (Signed)
Called to room to assist during endoscopic procedure.  Patient ID and intended procedure confirmed with present staff. Received instructions for my participation in the procedure from the performing physician.  

## 2016-08-10 NOTE — Patient Instructions (Signed)
Discharge instructions given. Biopsies taken. Handout on Gastritis. Prescription sent to pharmacy. YOU HAD AN ENDOSCOPIC PROCEDURE TODAY AT [facility]:   Refer to the procedure report that was given to you for any specific questions about what was found during the examination.  If the procedure report does not answer your questions, please call your gastroenterologist to clarify.  If you requested that your care partner not be given the details of your procedure findings, then the procedure report has been included in a sealed envelope for you to review at your convenience later.  YOU SHOULD EXPECT: Some feelings of bloating in the abdomen. Passage of more gas than usual.  Walking can help get rid of the air that was put into your GI tract during the procedure and reduce the bloating. If you had a lower endoscopy (such as a colonoscopy or flexible sigmoidoscopy) you may notice spotting of blood in your stool or on the toilet paper. If you underwent a bowel prep for your procedure, you may not have a normal bowel movement for a few days.  Please Note:  You might notice some irritation and congestion in your nose or some drainage.  This is from the oxygen used during your procedure.  There is no need for concern and it should clear up in a day or so.  SYMPTOMS TO REPORT IMMEDIATELY:   Following upper endoscopy (EGD)  Vomiting of blood or coffee ground material  New chest pain or pain under the shoulder blades  Painful or persistently difficult swallowing  New shortness of breath  Fever of 100F or higher  Black, tarry-looking stools  For urgent or emergent issues, a gastroenterologist can be reached at any hour by calling [phone].   DIET:  We do recommend a small meal at first, but then you may proceed to your regular diet.  Drink plenty of fluids but you should avoid alcoholic beverages for 24 hours.  ACTIVITY:  You should plan to take it easy for the rest of today  and you should NOT DRIVE or use heavy machinery until tomorrow (because of the sedation medicines used during the test).    FOLLOW UP: Our staff will call the number listed on your records the next business day following your procedure to check on you and address any questions or concerns that you may have regarding the information given to you following your procedure. If we do not reach you, we will leave a message.  However, if you are feeling well and you are not experiencing any problems, there is no need to return our call.  We will assume that you have returned to your regular daily activities without incident.  If any biopsies were taken you will be contacted by phone or by letter within the next 1-3 weeks.  Please call us at [phone] if you have not heard about the biopsies in 3 weeks.    SIGNATURES/CONFIDENTIALITY: You and/or your care partner have signed paperwork which will be entered into your electronic medical record.  These signatures attest to the fact that that the information above on your After Visit Summary has been reviewed and is understood.  Full responsibility of the confidentiality of this discharge information lies with you and/or your care-partner.

## 2016-08-11 ENCOUNTER — Telehealth: Payer: Self-pay | Admitting: *Deleted

## 2016-08-11 NOTE — Telephone Encounter (Signed)
  Follow up Call-  Call back number 08/10/2016 03/16/2015  Post procedure Call Back phone  # [phone]  Permission to leave phone message Yes Yes  Some recent data might be hidden     Patient questions:  Do you have a fever, pain , or abdominal swelling? No. Pain Score  0 *  Have you tolerated food without any problems? Yes.    Have you been able to return to your normal activities? Yes.    Do you have any questions about your discharge instructions: Diet   No. Medications  No. Follow up visit  No.  Do you have questions or concerns about your Care? No.  Actions: * If pain score is 4 or above: No action needed, pain <4.

## 2016-08-14 ENCOUNTER — Ambulatory Visit: Payer: Self-pay | Admitting: Family Medicine

## 2016-08-15 NOTE — Telephone Encounter (Signed)
Received call from Dr. Dossie Der' office and they offered him an appt tomorrow and one for 08/21/16 but pt told them he could not come those days. He will be put back in the scheduling pool for when the next cancellation comes up. Dr. Hilarie Fredrickson aware.

## 2016-08-21 ENCOUNTER — Telehealth: Payer: Self-pay

## 2016-08-21 NOTE — Telephone Encounter (Signed)
Pt is scheduled to see Dr. Dossie Der at [facility] 09/04/16@12 :45pm. Pt aware of appt.

## 2016-08-22 ENCOUNTER — Telehealth: Payer: Self-pay

## 2016-08-22 ENCOUNTER — Other Ambulatory Visit: Payer: Self-pay

## 2016-08-22 MED ORDER — BIS SUBCIT-METRONID-TETRACYC 140-125-125 MG PO CAPS: 3.0000 | ORAL_CAPSULE | Freq: Three times a day (TID) | ORAL | 0 refills | Status: DC

## 2016-08-22 NOTE — Telephone Encounter (Signed)
-----   Message from Jerene Bears, MD sent at 08/21/2016  6:10 PM EDT ----- Regarding: H. pylori positive Please let patient know pathology results show chronic active gastritis with H. pylori. This is likely the cause of his ulcers and also at least in part his abdominal pain  Needs to be treated with twice a day PPI and Pylera 3 caps by mouth 4 times a day 10 days  Stool antigen recommended off PPI therapy in 8-12 weeks to confirm eradication  He has also been referred to [facility] for possible choledochal cyst  Phone discussion regarding pathology results in lieu of pathology letter  JMP

## 2016-08-22 NOTE — Telephone Encounter (Signed)
Script sent to pharmacy for pylera. Pt already has script for PPI. Left message for pt to call back.

## 2016-08-24 ENCOUNTER — Other Ambulatory Visit: Payer: Self-pay

## 2016-08-24 DIAGNOSIS — B9681 Helicobacter pylori [H. pylori] as the cause of diseases classified elsewhere: Secondary | ICD-10-CM

## 2016-08-24 DIAGNOSIS — K2970 Gastritis, unspecified, without bleeding: Principal | ICD-10-CM

## 2016-08-24 NOTE — Telephone Encounter (Signed)
Spoke with pt and he is aware, order and reminder in epic.

## 2016-09-14 ENCOUNTER — Ambulatory Visit
Admission: RE | Admit: 2016-09-14 | Discharge: 2016-09-14 | Disposition: A | Discharge status: Home / Self Care | Payer: BLUE CROSS/BLUE SHIELD | Source: Ambulatory Visit | Attending: Medical | Admitting: Medical

## 2016-09-14 ENCOUNTER — Emergency Department
Admission: EM | Admit: 2016-09-14 | Discharge: 2016-09-14 | Disposition: A | Discharge status: Home / Self Care | Payer: BLUE CROSS/BLUE SHIELD | Attending: Emergency Medicine | Admitting: Emergency Medicine

## 2016-09-14 ENCOUNTER — Encounter: Payer: Self-pay | Admitting: Emergency Medicine

## 2016-09-14 ENCOUNTER — Other Ambulatory Visit: Payer: Self-pay | Admitting: Medical

## 2016-09-14 DIAGNOSIS — M79605 Pain in left leg: Secondary | ICD-10-CM | POA: Diagnosis present

## 2016-09-14 DIAGNOSIS — F17210 Nicotine dependence, cigarettes, uncomplicated: Secondary | ICD-10-CM | POA: Diagnosis not present

## 2016-09-14 DIAGNOSIS — E119 Type 2 diabetes mellitus without complications: Secondary | ICD-10-CM | POA: Insufficient documentation

## 2016-09-14 DIAGNOSIS — Z79899 Other long term (current) drug therapy: Secondary | ICD-10-CM | POA: Insufficient documentation

## 2016-09-14 DIAGNOSIS — I82412 Acute embolism and thrombosis of left femoral vein: Secondary | ICD-10-CM

## 2016-09-14 DIAGNOSIS — M7989 Other specified soft tissue disorders: Secondary | ICD-10-CM

## 2016-09-14 DIAGNOSIS — I824Y2 Acute embolism and thrombosis of unspecified deep veins of left proximal lower extremity: Secondary | ICD-10-CM | POA: Diagnosis not present

## 2016-09-14 DIAGNOSIS — I10 Essential (primary) hypertension: Secondary | ICD-10-CM | POA: Diagnosis not present

## 2016-09-14 DIAGNOSIS — Z7984 Long term (current) use of oral hypoglycemic drugs: Secondary | ICD-10-CM | POA: Insufficient documentation

## 2016-09-14 LAB — CBC WITH DIFFERENTIAL/PLATELET
BASOS ABS: 0 10*3/uL (ref 0–0.1)
BASOS PCT: 1 %
EOS PCT: 0 %
Eosinophils Absolute: 0 10*3/uL (ref 0–0.7)
HCT: 41.7 % (ref 40.0–52.0)
Hemoglobin: 14.5 g/dL (ref 13.0–18.0)
LYMPHS PCT: 23 %
Lymphs Abs: 1.9 10*3/uL (ref 1.0–3.6)
MCH: 32.6 pg (ref 26.0–34.0)
MCHC: 34.7 g/dL (ref 32.0–36.0)
MCV: 93.9 fL (ref 80.0–100.0)
MONO ABS: 0.6 10*3/uL (ref 0.2–1.0)
Monocytes Relative: 7 %
NEUTROS ABS: 5.9 10*3/uL (ref 1.4–6.5)
Neutrophils Relative %: 69 %
PLATELETS: 185 10*3/uL (ref 150–440)
RBC: 4.44 MIL/uL (ref 4.40–5.90)
RDW: 13.1 % (ref 11.5–14.5)
WBC: 8.5 10*3/uL (ref 3.8–10.6)

## 2016-09-14 LAB — BASIC METABOLIC PANEL
ANION GAP: 8 (ref 5–15)
BUN: 36 mg/dL — AB (ref 6–20)
CALCIUM: 9.5 mg/dL (ref 8.9–10.3)
CO2: 26 mmol/L (ref 22–32)
Chloride: 99 mmol/L — ABNORMAL LOW (ref 101–111)
Creatinine, Ser: 1.56 mg/dL — ABNORMAL HIGH (ref 0.61–1.24)
GFR calc Af Amer: 51 mL/min — ABNORMAL LOW (ref >60)
GFR, EST NON AFRICAN AMERICAN: 44 mL/min — AB (ref >60)
Glucose, Bld: 109 mg/dL — ABNORMAL HIGH (ref 65–99)
POTASSIUM: 4.5 mmol/L (ref 3.5–5.1)
SODIUM: 133 mmol/L — AB (ref 135–145)

## 2016-09-14 LAB — PROTIME-INR
INR: 0.96
PROTHROMBIN TIME: 12.8 s (ref 11.4–15.2)

## 2016-09-14 LAB — APTT: aPTT: 31 seconds (ref 24–36)

## 2016-09-14 MED ORDER — APIXABAN 5 MG PO TABS
10.0000 mg | ORAL_TABLET | Freq: Once | ORAL | Status: AC
  Administered 2016-09-14: 10 mg via ORAL

## 2016-09-14 MED ORDER — APIXABAN 5 MG PO TABS: ORAL_TABLET | ORAL | 0 refills | Status: DC

## 2016-09-14 MED ORDER — APIXABAN 5 MG PO TABS
ORAL_TABLET | ORAL | Status: AC
  Administered 2016-09-14: 10 mg via ORAL
  Filled 2016-09-14: qty 4

## 2016-09-14 NOTE — ED Triage Notes (Signed)
Pt sent for outpt [place], reports +DVT in left leg

## 2016-09-14 NOTE — ED Notes (Signed)
Patient presents to the ED with left leg pain since Sunday evening.  Patient reports "tightness" of his left calf.

## 2016-09-14 NOTE — ED Notes (Signed)
Pt discharged to home.  Family member driving.  Discharge instructions reviewed.  Verbalized understanding.  No questions or concerns at this time.  Teach back verified.  Pt in NAD.  No items left in ED.   

## 2016-09-14 NOTE — ED Provider Notes (Signed)
[facility] Emergency Department Provider Note   ____________________________________________   None    (approximate)  I have reviewed the triage vital signs and the nursing notes.   HISTORY  Chief Complaint Leg Pain   HPI Michael Roy is a 70 y.o. male with a history of diabetes as well as recent diagnosis of H. pylori status post treatment with triple antibiotic therapy as well as omeprazole who is presenting to the emergency department today with a swollen left lower extremity. He says the swelling has started this past Sunday. He was seen at the [facility] clinic today where he was sent for an outpatient ultrasound which was positive for DVT. He was then sent to the emergency department for further workup. He denies any chest pain or shortness of breath. Denies any blood in his stool or his urine. Denies being on any anticoagulation. Denies any prolonged, recent trips such as a car or plane. Endoscopy within the past month and a half but no other recent procedures.   Past Medical History:  Diagnosis Date  . Aortic atherosclerosis ([place])   . Arthritis   . Cataract    just starting  . Cervical spine fracture ([place])   . Diabetes mellitus    borderline  . Diverticulosis   . Dry cough    smokers cough per pt.   . Eating disorder   . Headache(784.0)   . Heart murmur   . Hepatic steatosis   . Hyperlipidemia   . Hypertension   . Personal history of tobacco use, presenting hazards to health 05/30/2016  . Tubular adenoma of colon     Patient Active Problem List   Diagnosis Date Noted  . Right hip pain 06/16/2016  . Nausea with vomiting 06/16/2016  . Loss of weight 06/16/2016  . Personal history of tobacco use, presenting hazards to health 05/30/2016  . History of colonic polyps 01/11/2015  . Routine general medical examination at a health care facility 07/09/2014  . Diabetes type 2, controlled ([place]) 09/26/2013  . Hematuria 08/19/2013  .  Numbness and tingling in left hand 10/09/2012  . Hypertension 05/29/2012  . Hyperlipidemia 05/29/2012  . Tobacco abuse 05/29/2012    Past Surgical History:  Procedure Laterality Date  . COLONOSCOPY  07-3012  . POLYPECTOMY  07-2012   18 polyps removed   . stitches Left    calf area as a child     Prior to Admission medications   Medication Sig Start Date End Date Taking? Authorizing Provider  amLODipine (NORVASC) 5 MG tablet Take 1 tablet (5 mg total) by mouth daily. 01/07/16   Jackolyn Confer, MD  atorvastatin (LIPITOR) 40 MG tablet Take 1 tablet (40 mg total) by mouth daily. 07/13/14 07/13/15  Jackolyn Confer, MD  BAYER CONTOUR NEXT TEST test strip USE AS DIRECTED 11/02/14   Jackolyn Confer, MD  bismuth-metronidazole-tetracycline [facility]) [phone] MG capsule Take 3 capsules by mouth 4 (four) times daily -  before meals and at bedtime. 08/22/16   Jerene Bears, MD  lisinopril-hydrochlorothiazide (PRINZIDE,ZESTORETIC) 20-25 MG tablet Take 1 tablet by mouth daily. 01/07/16   Jackolyn Confer, MD  metFORMIN (GLUCOPHAGE) 500 MG tablet TAKE ONE TABLET BY MOUTH TWICE DAILY WITH MEALS 11/02/14   Jackolyn Confer, MD  MICROLET LANCETS MISC USE 1 LANCET TWICE DAILY 11/02/14   Jackolyn Confer, MD  omeprazole (PRILOSEC) 40 MG capsule Take 1 capsule (40 mg total) by mouth 2 (two) times daily before a meal.  Omeprazole 40 mg by mouth twice daily (30 minutes before breakfast and dinner.) 08/10/16   Jerene Bears, MD  traMADol (ULTRAM) 50 MG tablet Take 1 tablet (50 mg total) by mouth every 8 (eight) hours as needed. 06/16/16   Jackolyn Confer, MD    Allergies Review of patient's allergies indicates no known allergies.  Family History  Problem Relation Age of Onset  . Hypertension Mother   . Diabetes Mother   . Heart disease Mother   . Hypertension Father   . Cancer Sister     colon  . Heart disease Sister   . Stroke Sister   . Colon cancer Sister   . Alcohol abuse Brother   . Cancer  Brother   . Sudden death Brother   . Colon polyps Brother   . Colon polyps Brother   . Colon polyps Brother   . Rectal cancer Neg Hx   . Stomach cancer Neg Hx     Social History Social History  Substance Use Topics  . Smoking status: Current Every Day Smoker    Packs/day: 1.00    Types: Cigarettes  . Smokeless tobacco: Never Used  . Alcohol use 4.2 oz/week    7 Cans of beer per week     Comment: beer    Review of Systems Constitutional: No fever/chills Eyes: No visual changes. ENT: No sore throat. Cardiovascular: Denies chest pain. Respiratory: Denies shortness of breath. Gastrointestinal: No abdominal pain.  No nausea, no vomiting.  No diarrhea.  No constipation. Genitourinary: Negative for dysuria. Musculoskeletal: Negative for back pain. Skin: Negative for rash. Neurological: Negative for headaches, focal weakness or numbness.  10-point ROS otherwise negative.  ____________________________________________   PHYSICAL EXAM:  VITAL SIGNS: ED Triage Vitals  Enc Vitals Group     BP 09/14/16 1554 129/84     Pulse Rate 09/14/16 1554 68     Resp 09/14/16 1554 18     Temp 09/14/16 1554 98.1 F (36.7 C)     Temp Source 09/14/16 1554 Oral     SpO2 09/14/16 1554 98 %     Weight 09/14/16 1555 152 lb (68.9 kg)     Height 09/14/16 1555 5\' 9"  (1.753 m)     Head Circumference --      Peak Flow --      Pain Score 09/14/16 1538 6     Pain Loc --      Pain Edu? --      Excl. in [place]? --     Constitutional: Alert and oriented. Well appearing and in no acute distress. Eyes: Conjunctivae are normal. PERRL. EOMI. Head: Atraumatic. Nose: No congestion/rhinnorhea. Mouth/Throat: Mucous membranes are moist.   Neck: No stridor.   Cardiovascular: Normal rate, regular rhythm. Grossly normal heart sounds.  Good peripheral circulation With intact, equal and bilateral dorsalis pedis pulses. Respiratory: Normal respiratory effort.  No retractions. Lungs CTAB. Gastrointestinal: Soft  and nontender. No distention.  Musculoskeletal: Left lower extremity with moderate edema from the left ankle just proximal to the left knee. No erythema, induration or pus. Neurologic:  Normal speech and language. No gross focal neurologic deficits are appreciated. Skin:  Skin is warm, dry and intact. No rash noted. Psychiatric: Mood and affect are normal. Speech and behavior are normal.  ____________________________________________   LABS (all labs ordered are listed, but only abnormal results are displayed)  Labs Reviewed  BASIC METABOLIC PANEL - Abnormal; Notable for the following:       Result Value  Sodium 133 (*)    Chloride 99 (*)    Glucose, Bld 109 (*)    BUN 36 (*)    Creatinine, Ser 1.56 (*)    GFR calc non Af Amer 44 (*)    GFR calc Af Amer 51 (*)    All other components within normal limits  CBC WITH DIFFERENTIAL/PLATELET  APTT  PROTIME-INR   ____________________________________________  EKG   ____________________________________________  RADIOLOGY US Venous Img Lower Unilateral Left (Accession [id number]) (Order [id number])  Imaging  Date: 09/14/2016 Department: [facility] ULTRASOUND Released By: Urban Gibson Authorizing: Talmage Nap, PA-C  Exam Information   Status Exam Begun  Exam Ended   Final [99] 09/14/2016 2:05 PM 09/14/2016 2:43 PM  PACS Images   Show images for US Venous Img Lower Unilateral Left  Study Result   CLINICAL DATA:  Left leg swelling  EXAM: LEFT LOWER EXTREMITY VENOUS DUPLEX ULTRASOUND  TECHNIQUE: Doppler venous assessment of the left lower extremity deep venous system was performed, including characterization of spectral flow, compressibility, and phasicity.  COMPARISON:  None.  FINDINGS: There is complete compressibility of the left common femoral vein. The proximal femoral vein is compressible. The mid and distal femoral vein are noncompressible. The popliteal vein  is noncompressible. There is also thrombus within the gastrocnemius veins. The location of these calf veins corresponds to the patient's location of pain.  IMPRESSION: The study is positive for occlusive DVT in the femoral vein, popliteal vein, and gastrocnemius veins.   Electronically Signed   By: Marybelle Killings M.D.   On: 09/14/2016 14:50      ____________________________________________   PROCEDURES  Procedure(s) performed:   Procedures  Critical Care performed:   ____________________________________________   INITIAL IMPRESSION / ASSESSMENT AND PLAN / ED COURSE  Pertinent labs & imaging results that were available during my care of the patient were reviewed by me and considered in my medical decision making (see chart for details).  ----------------------------------------- 7:37 PM on 09/14/2016 -----------------------------------------  I discussed the case with Dr. Ronalee Belts of the vascular service. He recommends that the patient be discharged on eliquis and that he follow-up in the office this Tuesday. I discussed this plan with the patient and he is understanding and willing to comply. We also discussed the risks and benefits of eliquis. He knows to return immediately to the emergency department for any abnormal bleeding in addition to any trauma, especially head trauma. We discussed the risks of this medication being increased bleeding and bruising and the dangers of internal bleeding with injury. He is understanding and willing to comply per will be discharged home. We'll give first dose here. Patient is requesting it tomorrow morning dose because he says he will not be able to make it to the pharmacy to fill the prescription prior to his job. He says he will be able to have obstruction but more even take his evening dose tomorrow. He denies any blood in his stool or urine.  Clinical Course     ____________________________________________   FINAL CLINICAL  IMPRESSION(S) / ED DIAGNOSES  Left lower extremity DVT.    NEW MEDICATIONS STARTED DURING THIS VISIT:  New Prescriptions   No medications on file     Note:  This document was prepared using Dragon voice recognition software and may include unintentional dictation errors.    Orbie Pyo, MD 09/14/16 [phone]

## 2016-09-18 ENCOUNTER — Ambulatory Visit (INDEPENDENT_AMBULATORY_CARE_PROVIDER_SITE_OTHER): Payer: BLUE CROSS/BLUE SHIELD | Admitting: Vascular Surgery

## 2016-09-18 ENCOUNTER — Encounter (INDEPENDENT_AMBULATORY_CARE_PROVIDER_SITE_OTHER): Payer: Self-pay | Admitting: Vascular Surgery

## 2016-09-18 VITALS — BP 133/80 | HR 82 | Resp 17 | Ht 69.0 in | Wt 151.0 lb

## 2016-09-18 DIAGNOSIS — M79605 Pain in left leg: Secondary | ICD-10-CM | POA: Diagnosis not present

## 2016-09-18 DIAGNOSIS — M79609 Pain in unspecified limb: Secondary | ICD-10-CM | POA: Insufficient documentation

## 2016-09-18 DIAGNOSIS — Z86718 Personal history of other venous thrombosis and embolism: Secondary | ICD-10-CM | POA: Insufficient documentation

## 2016-09-18 DIAGNOSIS — I10 Essential (primary) hypertension: Secondary | ICD-10-CM

## 2016-09-18 DIAGNOSIS — I82412 Acute embolism and thrombosis of left femoral vein: Secondary | ICD-10-CM | POA: Diagnosis not present

## 2016-09-18 DIAGNOSIS — M7989 Other specified soft tissue disorders: Secondary | ICD-10-CM | POA: Diagnosis not present

## 2016-09-18 MED ORDER — APIXABAN 5 MG PO TABS: ORAL_TABLET | ORAL | 6 refills | Status: DC

## 2016-09-18 NOTE — Progress Notes (Signed)
MRN : [id number]  Michael Roy is a 70 y.o. (December 20, 1945) male who presents with chief complaint of  Chief Complaint  Patient presents with  . New Patient (Initial Visit)  .  History of Present Illness:  The patient presents to the office for evaluation of DVT.  DVT was identified at [facility] by Duplex ultrasound.  The initial symptoms were pain and swelling in the lower extremity.  The patient notes the leg continues to be very painful with dependency and swells quite a bite.  Symptoms are much better with elevation.  The patient notes minimal edema in the morning which steadily worsens throughout the day.    The patient has not been using compression therapy at this point.  No SOB or pleuritic chest pains.  No cough or hemoptysis.  No blood per rectum or blood in any sputum.  No excessive bruising per the patient.    Current Outpatient Prescriptions  Medication Sig Dispense Refill  . amLODipine (NORVASC) 5 MG tablet Take 1 tablet (5 mg total) by mouth daily. 90 tablet 2  . apixaban (ELIQUIS) 5 MG TABS tablet After completing the first 7 days begin 5mg  po bid 60 tablet 6  . BAYER CONTOUR NEXT TEST test strip USE AS DIRECTED 100 each 6  . bismuth-metronidazole-tetracycline (PYLERA) 140-125-125 MG capsule Take 3 capsules by mouth 4 (four) times daily -  before meals and at bedtime. 120 capsule 0  . lisinopril-hydrochlorothiazide (PRINZIDE,ZESTORETIC) 20-25 MG tablet Take 1 tablet by mouth daily. 90 tablet 2  . metFORMIN (GLUCOPHAGE) 500 MG tablet TAKE ONE TABLET BY MOUTH TWICE DAILY WITH MEALS 180 tablet 6  . MICROLET LANCETS MISC USE 1 LANCET TWICE DAILY 100 each 6  . omeprazole (PRILOSEC) 40 MG capsule Take 1 capsule (40 mg total) by mouth 2 (two) times daily before a meal. Omeprazole 40 mg by mouth twice daily (30 minutes before breakfast and dinner.) 60 capsule 3  . traMADol (ULTRAM) 50 MG tablet Take 1 tablet (50 mg total) by mouth every 8 (eight) hours as needed. 90 tablet 1  .  atorvastatin (LIPITOR) 40 MG tablet Take 1 tablet (40 mg total) by mouth daily. 30 tablet 11   Current Facility-Administered Medications  Medication Dose Route Frequency Provider Last Rate Last Dose  . 0.9 %  sodium chloride infusion  500 mL Intravenous Continuous Jerene Bears, MD        Past Medical History:  Diagnosis Date  . Aortic atherosclerosis ([place])   . Arthritis   . Cataract    just starting  . Cervical spine fracture ([place])   . Diabetes mellitus    borderline  . Diverticulosis   . Dry cough    smokers cough per pt.   . Eating disorder   . Headache(784.0)   . Heart murmur   . Hepatic steatosis   . Hyperlipidemia   . Hypertension   . Personal history of tobacco use, presenting hazards to health 05/30/2016  . Tubular adenoma of colon     Past Surgical History:  Procedure Laterality Date  . COLONOSCOPY  07-3012  . POLYPECTOMY  07-2012   18 polyps removed   . stitches Left    calf area as a child     Social History Social History  Substance Use Topics  . Smoking status: Current Every Day Smoker    Packs/day: 1.00    Types: Cigarettes  . Smokeless tobacco: Never Used  . Alcohol use 4.2 oz/week    7  Cans of beer per week     Comment: beer    Family History Family History  Problem Relation Age of Onset  . Hypertension Mother   . Diabetes Mother   . Heart disease Mother   . Hypertension Father   . Cancer Sister     colon  . Heart disease Sister   . Stroke Sister   . Colon cancer Sister   . Alcohol abuse Brother   . Cancer Brother   . Sudden death Brother   . Colon polyps Brother   . Colon polyps Brother   . Colon polyps Brother   . Rectal cancer Neg Hx   . Stomach cancer Neg Hx     No Known Allergies   REVIEW OF SYSTEMS (Negative unless checked)  Constitutional: [] Weight loss  [] Fever  [] Chills Cardiac: [] Chest pain   [] Chest pressure   [] Palpitations   [] Shortness of breath at rest   [] Shortness of breath with exertion. Vascular:  [x] Pain in  legs with walking   [] Pain in legs at rest    [] Pain in feet at rest    [x] History of DVT   [x] Phlebitis   [x] Swelling in legs   [] Varicose veins   [] Non-healing ulcers Pulmonary:   [] Uses home oxygen   [] Productive cough   [] Hemoptysis   [] Wheeze  [] COPD   [] Asthma Neurologic:  [] Dizziness  [] Blackouts   [] Seizures   [] History of stroke   [] History of TIA  [] Aphasia   [] Temporary blindness   [] Dysphagia   [] Weakness or numbness in arm   [] Weakness or numbness in leg Musculoskeletal:  [] Arthritis   [] Joint swelling   [] Joint pain   [] Low back pain Hematologic:  [] Easy bruising  [] Easy bleeding   [] Hypercoagulable state   [] Anemic   Gastrointestinal:  [] Blood in stool   [] Vomiting blood  [] Gastroesophageal reflux/heartburn   [] Difficulty swallowing. Genitourinary:  [] Chronic kidney disease   [] Difficult urination  [] Frequent urination  [] Burning with urination   [] Blood in urine Skin:  [] Rashes   [] Ulcers   Psychological:  [] History of anxiety   []  History of major depression.    Physical Examination  Vitals:   09/18/16 1050  BP: 133/80  Pulse: 82  Resp: 17  Weight: 151 lb (68.5 kg)  Height: 5\' 9"  (1.753 m)   Body mass index is 22.3 kg/m. Gen:  WD/WN, NAD Head: [place]/AT, No temporalis wasting. Ear/Nose/Throat: Hearing grossly intact, nares w/o erythema or drainage, trachea midline Eyes: PERR, EOM appear normal. Sclera non-icteric Neck: Supple, no nuchal rigidity.  No bruit or JVD.  Pulmonary:  Good air movement, equal and clear to auscultation bilaterally.  Cardiac: RRR, normal S1, S2, no Murmurs, rubs or gallops. Vascular:   1-2+ edema of left leg tender to palpation no cord appreciated Vessel Right Left  Radial Palpable Palpable  Ulnar Palpable Palpable  Brachial Palpable Palpable  Carotid Palpable Palpable  Aorta Not palpable N/A  Femoral Palpable Palpable  Popliteal Palpable Palpable  PT Palpable Palpable  DP Palpable Palpable   Gastrointestinal: soft,  non-rigid/non-distended. No guarding.  Musculoskeletal: M/S 5/5 throughout.  No deformity or atrophy. Neurologic: CN 2-12 intact. Pain and light touch intact in extremities.  Speech is fluent. Motor exam as listed above. Psychiatric: Judgment intact, Mood & affect appropriate for pt's clinical situation. Dermatologic: No rashes or ulcers noted.  No signs consistent with cellulitis. Lymphatic:  No cervical lymphadenopathy  CBC Lab Results  Component Value Date   WBC 8.5 09/14/2016   HGB 14.5 09/14/2016  HCT 41.7 09/14/2016   MCV 93.9 09/14/2016   PLT 185 09/14/2016    BMET    Component Value Date/Time   NA 133 (L) 09/14/2016 1604   K 4.5 09/14/2016 1604   CL 99 (L) 09/14/2016 1604   CO2 26 09/14/2016 1604   GLUCOSE 109 (H) 09/14/2016 1604   BUN 36 (H) 09/14/2016 1604   CREATININE 1.56 (H) 09/14/2016 1604   CREATININE 1.39 (H) 06/16/2016 1544   CALCIUM 9.5 09/14/2016 1604   GFRNONAA 44 (L) 09/14/2016 1604   GFRAA 51 (L) 09/14/2016 1604   Estimated Creatinine Clearance: 43.9 mL/min (by C-G formula based on SCr of 1.56 mg/dL (H)).  COAG Lab Results  Component Value Date   INR 0.96 09/14/2016    Radiology US Venous Img Lower Unilateral Left  Result Date: 09/14/2016 CLINICAL DATA:  Left leg swelling EXAM: LEFT LOWER EXTREMITY VENOUS DUPLEX ULTRASOUND TECHNIQUE: Doppler venous assessment of the left lower extremity deep venous system was performed, including characterization of spectral flow, compressibility, and phasicity. COMPARISON:  None. FINDINGS: There is complete compressibility of the left common femoral vein. The proximal femoral vein is compressible. The mid and distal femoral vein are noncompressible. The popliteal vein is noncompressible. There is also thrombus within the gastrocnemius veins. The location of these calf veins corresponds to the patient's location of pain. IMPRESSION: The study is positive for occlusive DVT in the femoral vein, popliteal vein, and  gastrocnemius veins. Electronically Signed   By: Marybelle Killings M.D.   On: 09/14/2016 14:50      Assessment/Plan 1. Acute deep vein thrombosis (DVT) of femoral vein of left lower extremity (HCC) Recommend:   No surgery or intervention at this point in time.  IVC filter is not indicated at present.  Patient's duplex ultrasound of the venous system shows DVT from the popliteal to the femoral veins.  The patient is initiated on anticoagulation   Elevation was stressed, use of a recliner was discussed.  I have had a long discussion with the patient regarding DVT and post phlebitic changes such as swelling and why it  causes symptoms such as pain.  The patient will wear graduated compression stockings class 1 (20-30 mmHg), beginning after three full days of anticoagulation, on a daily basis a prescription was given. The patient will  beginning wearing the stockings first thing in the morning and removing them in the evening. The patient is instructed specifically not to sleep in the stockings.  In addition, behavioral modification including elevation during the day and avoidance of prolonged dependency will be initiated.    The patient will continue anticoagulation for now as there have not been any problems or complications from his anticoagulation therapy at this point.  - VAS [place] LOWER EXTREMITY VENOUS (DVT); Future  2. Swelling, limb Begin compression and elevation  3. Pain of left lower extremity Will anticoagulate  4. Essential hypertension Continue antihypertensive medications as ordered no changes    Hortencia Pilar, MD  09/18/2016 11:22 AM    This note was created with Dragon medical transcription system.  Any errors from dictation are purely unintentional

## 2016-09-20 ENCOUNTER — Ambulatory Visit (INDEPENDENT_AMBULATORY_CARE_PROVIDER_SITE_OTHER): Payer: BLUE CROSS/BLUE SHIELD | Admitting: Family Medicine

## 2016-09-20 ENCOUNTER — Encounter: Payer: Self-pay | Admitting: Family Medicine

## 2016-09-20 VITALS — BP 124/84 | HR 92 | Temp 98.3°F | Wt 153.1 lb

## 2016-09-20 DIAGNOSIS — R112 Nausea with vomiting, unspecified: Secondary | ICD-10-CM

## 2016-09-20 DIAGNOSIS — I10 Essential (primary) hypertension: Secondary | ICD-10-CM | POA: Diagnosis not present

## 2016-09-20 DIAGNOSIS — R634 Abnormal weight loss: Secondary | ICD-10-CM | POA: Diagnosis not present

## 2016-09-20 DIAGNOSIS — I82412 Acute embolism and thrombosis of left femoral vein: Secondary | ICD-10-CM

## 2016-09-20 LAB — BASIC METABOLIC PANEL
BUN: 22 mg/dL (ref 6–23)
CHLORIDE: 101 meq/L (ref 96–112)
CO2: 28 meq/L (ref 19–32)
CREATININE: 1.14 mg/dL (ref 0.40–1.50)
Calcium: 9.7 mg/dL (ref 8.4–10.5)
GFR: 82.11 mL/min (ref >60.00)
Glucose, Bld: 95 mg/dL (ref 70–99)
Potassium: 4.5 mEq/L (ref 3.5–5.1)
Sodium: 136 mEq/L (ref 135–145)

## 2016-09-20 MED ORDER — LISINOPRIL-HYDROCHLOROTHIAZIDE 20-25 MG PO TABS: 1.0000 | ORAL_TABLET | Freq: Every day | ORAL | 2 refills | Status: DC

## 2016-09-20 MED ORDER — AMLODIPINE BESYLATE 5 MG PO TABS: 5.0000 mg | ORAL_TABLET | Freq: Every day | ORAL | 2 refills | Status: DC

## 2016-09-20 NOTE — Assessment & Plan Note (Signed)
Patient's weight has started to increase slowly. Appetite is good. We'll continue to monitor.

## 2016-09-20 NOTE — Patient Instructions (Signed)
Nice to see you. Please continue your Eliquis. Please monitor for nausea, vomiting, abdominal pain. If this occurs again please let us know immediately. Please monitor blood pressure at home. If you develop chest pain or shortness of breath please seek medical attention immediately.

## 2016-09-20 NOTE — Progress Notes (Signed)
  [name], MD Phone: [phone]  Michael Roy is a 70 y.o. male who presents today for follow-up.  DVT: Diagnosed in the ED after having left lower external swelling and an ultrasound revealing DVT. Started on Eliquis. He's tolerated this. He has followed with vein and vascular surgery. They're going to monitor and have him follow-up in 6 months. He is to wear compression stockings as well. He is going to continue Eliquis.  Patient notes his nausea, vomiting, abdominal pain, and early satiety have resolved with treatment of H. pylori. He met with general surgery regarding a choledochocyst and they're going to follow up on this in 4 months. He has been eating well. Bowel movements have been slightly loose though no blood.  Hypertension: Patient notes doesn't check often at home though typically runs similar to today. He is taking amlodipine, lisinopril, HCTZ. No chest pain or shortness of breath. No edema prior to finding out he had a DVT.  PMH: Smoker   ROS see history of present illness  Objective  Physical Exam Vitals:   09/20/16 1358  BP: 124/84  Pulse: 92  Temp: 98.3 F (36.8 C)    BP Readings from Last 3 Encounters:  09/20/16 124/84  09/18/16 133/80  09/14/16 107/73   Wt Readings from Last 3 Encounters:  09/20/16 153 lb 2 oz (69.5 kg)  09/18/16 151 lb (68.5 kg)  09/14/16 152 lb (68.9 kg)    Physical Exam  Constitutional: He is well-developed, well-nourished, and in no distress.  Cardiovascular: Normal rate, regular rhythm and normal heart sounds.   Pulmonary/Chest: Effort normal and breath sounds normal.  Abdominal: Soft. Bowel sounds are normal. He exhibits no distension. There is no tenderness. There is no rebound and no guarding.  Musculoskeletal:  Left lower extremity with swelling compared to right lower extremity, some prominent superficial vessels in left calf, nontender  Neurological: He is alert. Gait normal.  Skin: Skin is warm and dry.      Assessment/Plan: Please see individual problem list.  Hypertension At goal. Refill medications. Check BMET as Cr slightly elevated from ED.   DVT (deep venous thrombosis) ([place]) Diagnosed on [place] LLE. Currently on eliquis 10 mg BID for 7 days, then will be on eliquis 5 mg BID. Is following with vascular surgery for this. We will follow along as well. Patient is given return precautions.  Nausea with vomiting Resolved with treatment of H. pylori. Benign abdominal exam today. He'll monitor for recurrence. He'll keep his appointment with surgery regarding the choledochocyst area  Loss of weight Patient's weight has started to increase slowly. Appetite is good. We'll continue to monitor.   Orders Placed This Encounter  Procedures  . Basic Metabolic Panel (BMET)    Meds ordered this encounter  Medications  . lisinopril-hydrochlorothiazide (PRINZIDE,ZESTORETIC) 20-25 MG tablet    Sig: Take 1 tablet by mouth daily.    Dispense:  90 tablet    Refill:  2  . amLODipine (NORVASC) 5 MG tablet    Sig: Take 1 tablet (5 mg total) by mouth daily.    Dispense:  90 tablet    Refill:  2    [name], MD [place]

## 2016-09-20 NOTE — Progress Notes (Signed)
Pre visit review using our clinic review tool, if applicable. No additional management support is needed unless otherwise documented below in the visit note. 

## 2016-09-20 NOTE — Assessment & Plan Note (Signed)
At goal. Refill medications. Check BMET as Cr slightly elevated from ED.

## 2016-09-20 NOTE — Assessment & Plan Note (Addendum)
Diagnosed on [place] LLE. Currently on eliquis 10 mg BID for 7 days, then will be on eliquis 5 mg BID. Is following with vascular surgery for this. We will follow along as well. Patient is given return precautions.

## 2016-09-20 NOTE — Assessment & Plan Note (Signed)
Resolved with treatment of H. pylori. Benign abdominal exam today. He'll monitor for recurrence. He'll keep his appointment with surgery regarding the choledochocyst area

## 2016-12-05 LAB — HM DIABETES EYE EXAM

## 2016-12-14 ENCOUNTER — Encounter: Payer: Self-pay | Admitting: Family Medicine

## 2016-12-22 ENCOUNTER — Ambulatory Visit (INDEPENDENT_AMBULATORY_CARE_PROVIDER_SITE_OTHER): Payer: BLUE CROSS/BLUE SHIELD | Admitting: Family Medicine

## 2016-12-22 ENCOUNTER — Encounter: Payer: Self-pay | Admitting: Family Medicine

## 2016-12-22 VITALS — BP 110/62 | HR 85 | Temp 97.8°F | Wt 159.2 lb

## 2016-12-22 DIAGNOSIS — I82412 Acute embolism and thrombosis of left femoral vein: Secondary | ICD-10-CM

## 2016-12-22 DIAGNOSIS — E119 Type 2 diabetes mellitus without complications: Secondary | ICD-10-CM

## 2016-12-22 DIAGNOSIS — I10 Essential (primary) hypertension: Secondary | ICD-10-CM

## 2016-12-22 DIAGNOSIS — Z720 Tobacco use: Secondary | ICD-10-CM | POA: Diagnosis not present

## 2016-12-22 NOTE — Assessment & Plan Note (Signed)
Not interested in smoking cessation. He will continue lung cancer screening protocol with CT scans yearly.

## 2016-12-22 NOTE — Assessment & Plan Note (Signed)
Appears to be doing well on Eliquis. Swelling is quite a bit improved. No pain. Discussed bleeding precautions. Continue to follow with vascular surgery.

## 2016-12-22 NOTE — Progress Notes (Signed)
  [name], MD Phone: [phone]  Michael Roy is a 71 y.o. male who presents today for   HYPERTENSION  Disease Monitoring  Home BP Monitoring not checking Chest pain- no    Dyspnea- no Medications  Compliance-  Taking lisinopril, HCTZ, amlodipine.  Edema- yes, see DVT  DIABETES Disease Monitoring: Blood Sugar ranges-not checking Polyuria/phagia/dipsia- no      Visual problems- no Medications: Compliance- not taking medication   Left lower extremity DVT: Notes the swelling has progressively improved. Has some swelling at the end of the day though this improves with propping his legs up. He is wearing compression stockings. He is on Eliquis twice daily. No bleeding. No pain in his leg. No chest pain or trouble breathing.  Tobacco abuse: Has smoked 1-2 packs a day since he was 89. Continues to smoke and is not ready to smoke. He is currently under the lung cancer screening protocol for CT scanning.  PMH: Smoker   ROS see history of present illness  Objective  Physical Exam Vitals:   12/22/16 1356  BP: 110/62  Pulse: 85  Temp: 97.8 F (36.6 C)    BP Readings from Last 3 Encounters:  12/22/16 110/62  09/20/16 124/84  09/18/16 133/80   Wt Readings from Last 3 Encounters:  12/22/16 159 lb 3.2 oz (72.2 kg)  09/20/16 153 lb 2 oz (69.5 kg)  09/18/16 151 lb (68.5 kg)    Physical Exam  Constitutional: No distress.  Cardiovascular: Normal rate, regular rhythm and normal heart sounds.   Pulmonary/Chest: Effort normal and breath sounds normal.  Musculoskeletal: He exhibits no edema.  No calf tenderness or swelling bilaterally  Neurological: He is alert. Gait normal.  Skin: He is not diaphoretic.   Diabetic Foot Exam - Simple   Simple Foot Form Diabetic Foot exam was performed with the following findings:  Yes 12/22/2016  2:28 PM  Visual Inspection No deformities, no ulcerations, no other skin breakdown bilaterally:  Yes Sensation Testing Intact to touch  and monofilament testing bilaterally:  Yes Pulse Check Posterior Tibialis and Dorsalis pulse intact bilaterally:  Yes Comments      Assessment/Plan: Please see individual problem list.  DVT (deep venous thrombosis) (HCC) Appears to be doing well on Eliquis. Swelling is quite a bit improved. No pain. Discussed bleeding precautions. Continue to follow with vascular surgery.  Hypertension At goal. Continue current medications.  Diabetes type 2, controlled ([place]) Previously well controlled. Not on medication at this time. We'll check an A1c, CMP, and lipid panel. Patient does report he is not consistently taking his cholesterol medicine either. He was provided with a prescription for labs to obtain at [facility] as he reports this is where he has gotten them done previously. Advised him to have them fax [place] the results.  Tobacco abuse Not interested in smoking cessation. He will continue lung cancer screening protocol with CT scans yearly.   [name], MD [place]

## 2016-12-22 NOTE — Assessment & Plan Note (Signed)
At goal. Continue current medications. 

## 2016-12-22 NOTE — Progress Notes (Signed)
Pre visit review using our clinic review tool, if applicable. No additional management support is needed unless otherwise documented below in the visit note. 

## 2016-12-22 NOTE — Assessment & Plan Note (Signed)
Previously well controlled. Not on medication at this time. We'll check an A1c, CMP, and lipid panel. Patient does report he is not consistently taking his cholesterol medicine either. He was provided with a prescription for labs to obtain at [facility] as he reports this is where he has gotten them done previously. Advised him to have them fax [place] the results.

## 2016-12-22 NOTE — Patient Instructions (Signed)
Nice to see you. Please continue your blood pressure medications. Please continue the Eliquis. If you develop bleeding issues and cannot get the bleeding to stop fairly quickly please seek medical attention. Please get the lab work done through [facility].

## 2016-12-25 ENCOUNTER — Encounter: Payer: Self-pay | Admitting: Family Medicine

## 2016-12-26 ENCOUNTER — Encounter: Payer: Self-pay | Admitting: Family Medicine

## 2016-12-26 LAB — BASIC METABOLIC PANEL
BUN: 18 mg/dL (ref 4–21)
CREATININE: 1.3 mg/dL (ref 0.6–1.3)
GLUCOSE: 115 mg/dL

## 2016-12-26 LAB — HEPATIC FUNCTION PANEL
ALK PHOS: 68 U/L (ref 25–125)
ALT: 23 U/L (ref 10–40)
AST: 24 U/L (ref 14–40)
BILIRUBIN, TOTAL: 0.3 mg/dL

## 2016-12-26 LAB — LIPID PANEL
CHOLESTEROL: 240 mg/dL — AB (ref 0–200)
HDL: 48 mg/dL (ref 35–70)
LDL Cholesterol: 174 mg/dL
TRIGLYCERIDES: 89 mg/dL (ref 40–160)

## 2016-12-26 LAB — HEMOGLOBIN A1C: HEMOGLOBIN A1C: 5.9

## 2017-01-01 ENCOUNTER — Telehealth: Payer: Self-pay

## 2017-01-01 ENCOUNTER — Other Ambulatory Visit: Payer: Self-pay | Admitting: *Deleted

## 2017-01-01 MED ORDER — ATORVASTATIN CALCIUM 40 MG PO TABS: 40.0000 mg | ORAL_TABLET | Freq: Every day | ORAL | 11 refills | Status: DC

## 2017-01-01 NOTE — Telephone Encounter (Signed)
Noted. We'll plan on rechecking cholesterol at next visit.

## 2017-01-01 NOTE — Telephone Encounter (Signed)
Lat filled by Dr Gilford Rile 07/13/14 30 11rf

## 2017-01-01 NOTE — Telephone Encounter (Signed)
Patient notified of lab results from [facility]. Patient states he has not been taking the Lipitor everyday but will start to take it daily.

## 2017-01-01 NOTE — Telephone Encounter (Signed)
Pt requested a medication refill for atorvastatin  Pharmacy [facility] on [address]

## 2017-01-05 ENCOUNTER — Other Ambulatory Visit (HOSPITAL_COMMUNITY): Payer: Self-pay | Admitting: General Surgery

## 2017-01-05 DIAGNOSIS — Q444 Choledochal cyst: Secondary | ICD-10-CM

## 2017-02-19 ENCOUNTER — Other Ambulatory Visit: Payer: Self-pay

## 2017-02-19 MED ORDER — TRAMADOL HCL 50 MG PO TABS: 50.0000 mg | ORAL_TABLET | Freq: Three times a day (TID) | ORAL | 1 refills | Status: DC | PRN

## 2017-02-19 NOTE — Telephone Encounter (Signed)
Last OV 12/22/16 last filled by Dr.Walker 06/16/16 90 1rf

## 2017-03-19 ENCOUNTER — Ambulatory Visit (INDEPENDENT_AMBULATORY_CARE_PROVIDER_SITE_OTHER): Payer: BLUE CROSS/BLUE SHIELD | Admitting: Vascular Surgery

## 2017-03-19 ENCOUNTER — Encounter (INDEPENDENT_AMBULATORY_CARE_PROVIDER_SITE_OTHER): Payer: Self-pay | Admitting: Vascular Surgery

## 2017-03-19 ENCOUNTER — Ambulatory Visit (INDEPENDENT_AMBULATORY_CARE_PROVIDER_SITE_OTHER): Payer: BLUE CROSS/BLUE SHIELD

## 2017-03-19 VITALS — BP 133/79 | HR 62 | Resp 17 | Wt 159.0 lb

## 2017-03-19 DIAGNOSIS — E119 Type 2 diabetes mellitus without complications: Secondary | ICD-10-CM

## 2017-03-19 DIAGNOSIS — I10 Essential (primary) hypertension: Secondary | ICD-10-CM

## 2017-03-19 DIAGNOSIS — I82412 Acute embolism and thrombosis of left femoral vein: Secondary | ICD-10-CM | POA: Diagnosis not present

## 2017-03-19 DIAGNOSIS — E782 Mixed hyperlipidemia: Secondary | ICD-10-CM

## 2017-03-19 NOTE — Progress Notes (Signed)
MRN : [id number]  Michael Roy is a 71 y.o. (08-25-1946) male who presents with chief complaint of  Chief Complaint  Patient presents with  . Follow-up  .  History of Present Illness:  The patient presents to the office for evaluation of DVT.  DVT was identified at [facility] by Duplex ultrasound on September 14, 2016.  The initial symptoms were pain and swelling in the lower extremity.  This occurred as an outpatient and he was seen in the ER and started on Eliquis.  The patient notes the leg continues to be very painful with dependency and swells quite a bite.  Symptoms are much better with elevation.  The patient notes minimal edema in the morning which steadily worsens throughout the day.    The patient has not been using compression therapy at this point.  No SOB or pleuritic chest pains.  No cough or hemoptysis.  No blood per rectum or blood in any sputum.  No excessive bruising per the patient.       Current Meds  Medication Sig  . amLODipine (NORVASC) 5 MG tablet Take 1 tablet (5 mg total) by mouth daily.  Marland Kitchen apixaban (ELIQUIS) 5 MG TABS tablet After completing the first 7 days begin 5mg  po bid  . atorvastatin (LIPITOR) 40 MG tablet Take 1 tablet (40 mg total) by mouth daily.  Marland Kitchen BAYER CONTOUR NEXT TEST test strip USE AS DIRECTED  . bismuth-metronidazole-tetracycline (PYLERA) 140-125-125 MG capsule Take 3 capsules by mouth 4 (four) times daily -  before meals and at bedtime.  Marland Kitchen lisinopril-hydrochlorothiazide (PRINZIDE,ZESTORETIC) 20-25 MG tablet Take 1 tablet by mouth daily.  . metFORMIN (GLUCOPHAGE) 500 MG tablet TAKE ONE TABLET BY MOUTH TWICE DAILY WITH MEALS  . MICROLET LANCETS MISC USE 1 LANCET TWICE DAILY  . omeprazole (PRILOSEC) 40 MG capsule Take 1 capsule (40 mg total) by mouth 2 (two) times daily before a meal. Omeprazole 40 mg by mouth twice daily (30 minutes before breakfast and dinner.)  . traMADol (ULTRAM) 50 MG tablet Take 1 tablet (50 mg total) by mouth every 8  (eight) hours as needed.   Current Facility-Administered Medications for the 03/19/17 encounter (Office Visit) with Katha Cabal, MD  Medication  . 0.9 %  sodium chloride infusion    Past Medical History:  Diagnosis Date  . Aortic atherosclerosis ([place])   . Arthritis   . Cataract    just starting  . Cervical spine fracture ([place])   . Diabetes mellitus    borderline  . Diverticulosis   . Dry cough    smokers cough per pt.   . Eating disorder   . Headache(784.0)   . Heart murmur   . Hepatic steatosis   . Hyperlipidemia   . Hypertension   . Personal history of tobacco use, presenting hazards to health 05/30/2016  . Tubular adenoma of colon     Past Surgical History:  Procedure Laterality Date  . COLONOSCOPY  07-3012  . POLYPECTOMY  07-2012   18 polyps removed   . stitches Left    calf area as a child     Social History Social History  Substance Use Topics  . Smoking status: Current Every Day Smoker    Packs/day: 1.00    Types: Cigarettes  . Smokeless tobacco: Never Used  . Alcohol use 4.2 oz/week    7 Cans of beer per week     Comment: beer    Family History Family History  Problem Relation Age  of Onset  . Hypertension Mother   . Diabetes Mother   . Heart disease Mother   . Hypertension Father   . Cancer Sister     colon  . Heart disease Sister   . Stroke Sister   . Colon cancer Sister   . Alcohol abuse Brother   . Cancer Brother   . Sudden death Brother   . Colon polyps Brother   . Colon polyps Brother   . Colon polyps Brother   . Rectal cancer Neg Hx   . Stomach cancer Neg Hx     No Known Allergies   REVIEW OF SYSTEMS (Negative unless checked)  Constitutional: [] Weight loss  [] Fever  [] Chills Cardiac: [] Chest pain   [] Chest pressure   [] Palpitations   [] Shortness of breath when laying flat   [] Shortness of breath with exertion. Vascular:  [] Pain in legs with walking   [] Pain in legs at rest  [x] History of DVT   [] Phlebitis   [x] Swelling in  legs   [] Varicose veins   [] Non-healing ulcers Pulmonary:   [] Uses home oxygen   [] Productive cough   [] Hemoptysis   [] Wheeze  [] COPD   [] Asthma Neurologic:  [] Dizziness   [] Seizures   [] History of stroke   [] History of TIA  [] Aphasia   [] Vissual changes   [] Weakness or numbness in arm   [] Weakness or numbness in leg Musculoskeletal:   [] Joint swelling   [x] Joint pain   [] Low back pain Hematologic:  [] Easy bruising  [] Easy bleeding   [] Hypercoagulable state   [] Anemic Gastrointestinal:  [] Diarrhea   [] Vomiting  [] Gastroesophageal reflux/heartburn   [] Difficulty swallowing. Genitourinary:  [] Chronic kidney disease   [] Difficult urination  [] Frequent urination   [] Blood in urine Skin:  [] Rashes   [] Ulcers  Psychological:  [] History of anxiety   []  History of major depression.  Physical Examination  Vitals:   03/19/17 1017  BP: 133/79  Pulse: 62  Resp: 17  Weight: 159 lb (72.1 kg)   Body mass index is 23.48 kg/m. Gen: WD/WN, NAD Head: [place]/AT, No temporalis wasting.  Ear/Nose/Throat: Hearing grossly intact, nares w/o erythema or drainage Eyes: PER, EOMI, sclera nonicteric.  Neck: Supple, no large masses.   Pulmonary:  Good air movement, no audible wheezing bilaterally, no use of accessory muscles.  Cardiac: RRR, no JVD Vascular: trace edema left leg; wearing compression Vessel Right Left  Radial Palpable Palpable  Ulnar Palpable Palpable  Brachial Palpable Palpable  PT Palpable Palpable  DP Palpable Palpable  Gastrointestinal: Non-distended. No guarding/no peritoneal signs.  Musculoskeletal: M/S 5/5 throughout.  No deformity or atrophy.  Neurologic: CN 2-12 intact. Symmetrical.  Speech is fluent. Motor exam as listed above. Psychiatric: Judgment intact, Mood & affect appropriate for pt's clinical situation. Dermatologic: No rashes or ulcers noted.  No changes consistent with cellulitis. Lymph : No lichenification or skin changes of chronic lymphedema.  CBC Lab Results    Component Value Date   WBC 8.5 09/14/2016   HGB 14.5 09/14/2016   HCT 41.7 09/14/2016   MCV 93.9 09/14/2016   PLT 185 09/14/2016    BMET    Component Value Date/Time   NA 136 09/20/2016 1432   K 4.5 09/20/2016 1432   CL 101 09/20/2016 1432   CO2 28 09/20/2016 1432   GLUCOSE 95 09/20/2016 1432   BUN 18 12/26/2016   CREATININE 1.3 12/26/2016   CREATININE 1.14 09/20/2016 1432   CREATININE 1.39 (H) 06/16/2016 1544   CALCIUM 9.7 09/20/2016 1432   GFRNONAA 44 (L)  09/14/2016 1604   GFRAA 51 (L) 09/14/2016 1604   CrCl cannot be calculated (Patient's most recent lab result is older than the maximum 21 days allowed.).  COAG Lab Results  Component Value Date   INR 0.96 09/14/2016    Radiology No results found.  Assessment/Plan 1. Acute deep vein thrombosis (DVT) of femoral vein of left lower extremity (HCC) Recommend:   No surgery or intervention at this point in time.  IVC filter is not indicated at present.  Patient's duplex ultrasound of the venous system shows chronic left DVT in the popliteal to the femoral veins.  The patient is on anticoagulation   Elevation was stressed, use of a recliner was discussed.  I have had a long discussion with the patient regarding DVT and post phlebitic changes such as swelling and why it  causes symptoms such as pain.  The patient will continue to wear graduated compression stockings class 1 (20-30 mmHg). The patient will  beginning wearing the stockings first thing in the morning and removing them in the evening. The patient is instructed specifically not to sleep in the stockings.  In addition, behavioral modification including elevation during the day and avoidance of prolonged dependency will be initiated.    The patient will continue anticoagulation for one more month and then stop the Eliquis as it has now been about 6 months and he just refilled it  2. Essential hypertension Continue antihypertensive medications as already  ordered, these medications have been reviewed and there are no changes at this time.   3. Controlled type 2 diabetes mellitus without complication, without long-term current use of insulin ([place]) Continue hypoglycemic medications as already ordered, these medications have been reviewed and there are no changes at this time.  Hgb A1C to be monitored as already arranged by primary service   4. Mixed hyperlipidemia Continue statin as ordered and reviewed, no changes at this time     Hortencia Pilar, MD  03/19/2017 10:28 AM

## 2017-03-26 ENCOUNTER — Ambulatory Visit: Payer: Self-pay | Admitting: Family Medicine

## 2017-04-08 IMAGING — CT CT ABD-PELV W/ CM
1 of 3 series · 14 of 32 positions shown, 19 images · IV contrast (APPLIED)
Comparison: None.

CLINICAL DATA: Loss of appetite. Two episodes of severe nausea and
vomiting in the past month.

EXAM:
CT ABDOMEN AND PELVIS WITH CONTRAST
TECHNIQUE: Multidetector CT imaging of the abdomen and pelvis was performed
using the standard protocol following bolus administration of
intravenous contrast.
CONTRAST:  100 cc [id number]

[Series 2: axial st · axial · 0.73mm/px · z∈[-909,-514]mm · 14 of 89 slices shown, 19 images]
[im 5/89  soft-tissue]
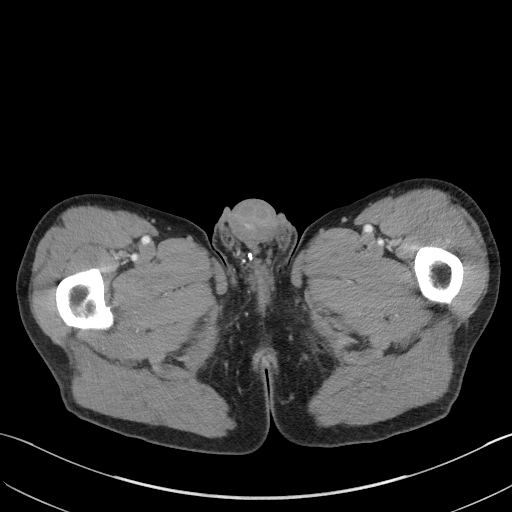
[im 5/89  bone]
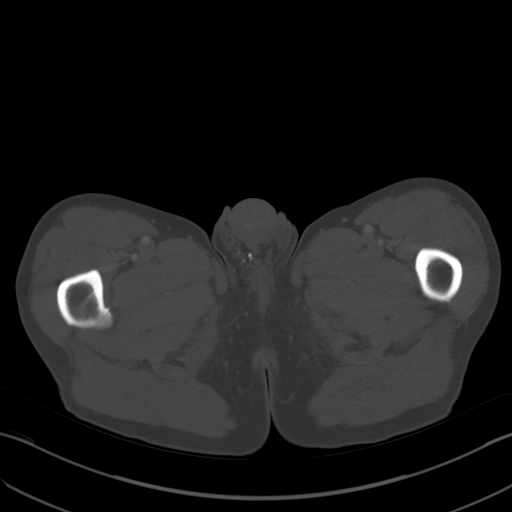
[im 14/89  soft-tissue]
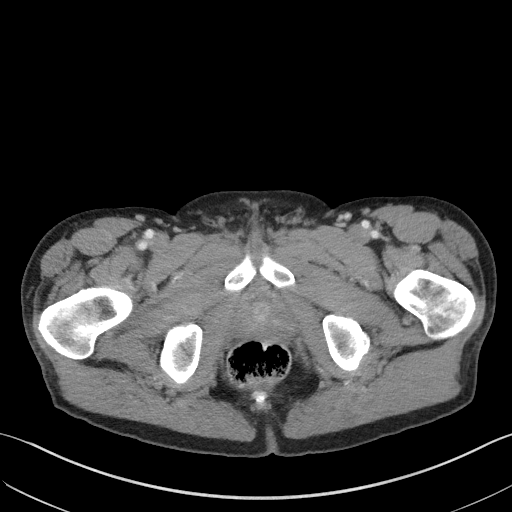
[im 19/89  soft-tissue]
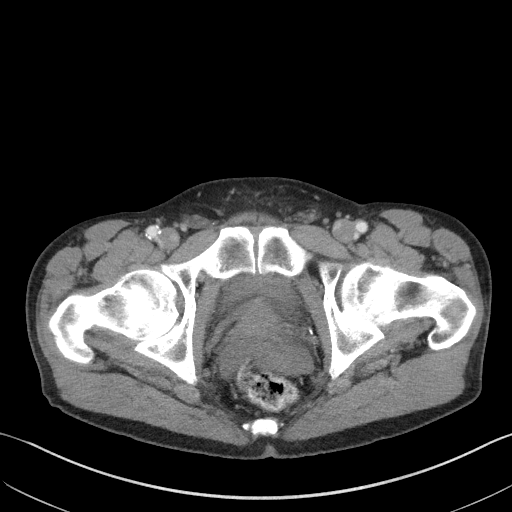
[im 24/89  soft-tissue]
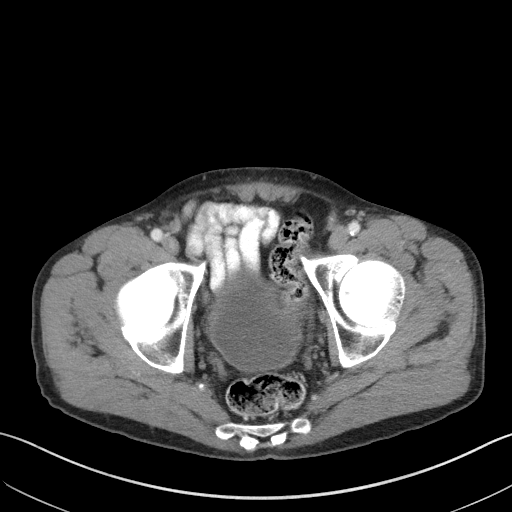
[im 33/89  soft-tissue]
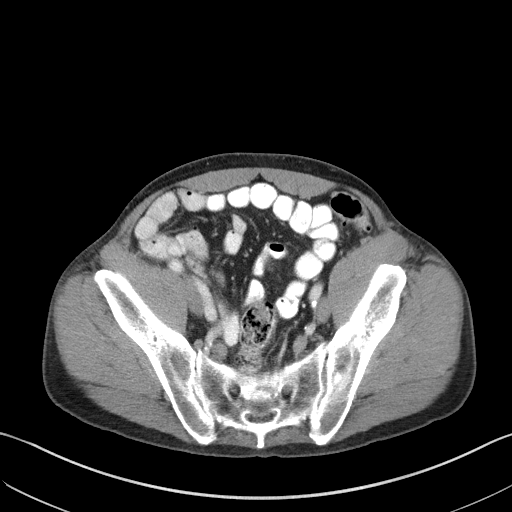
[im 38/89  soft-tissue]
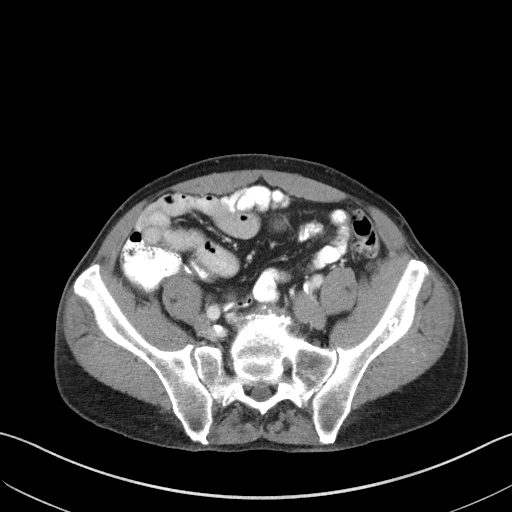
[im 47/89  soft-tissue]
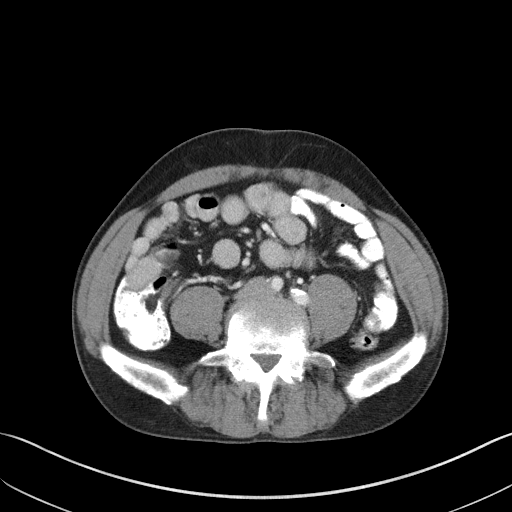
[im 51/89  soft-tissue]
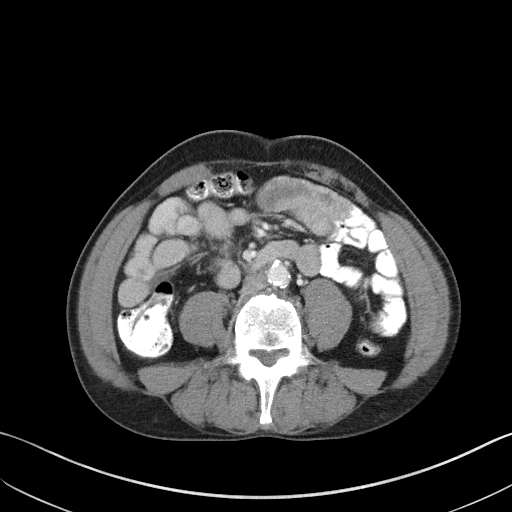
[im 56/89  soft-tissue]
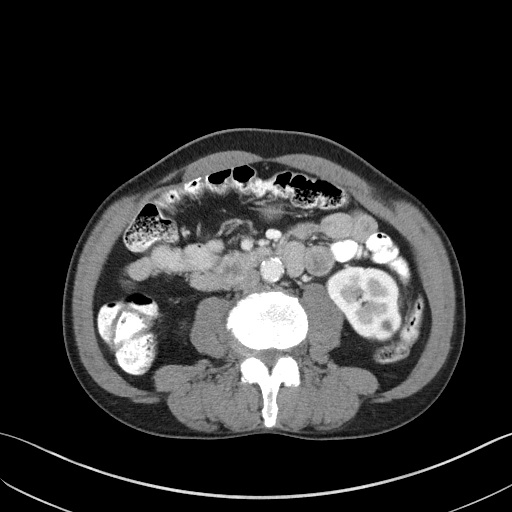
[im 56/89  bone]
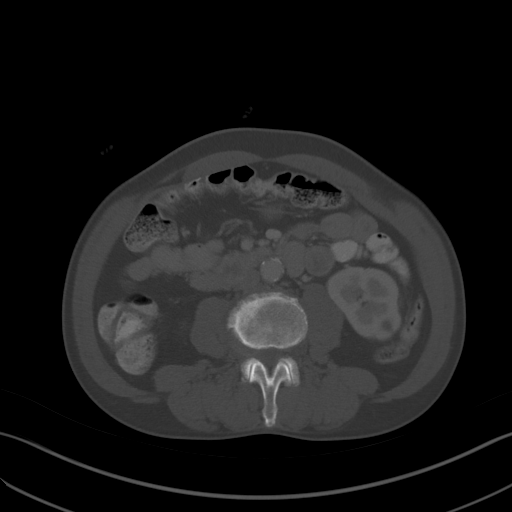
[im 65/89  soft-tissue]
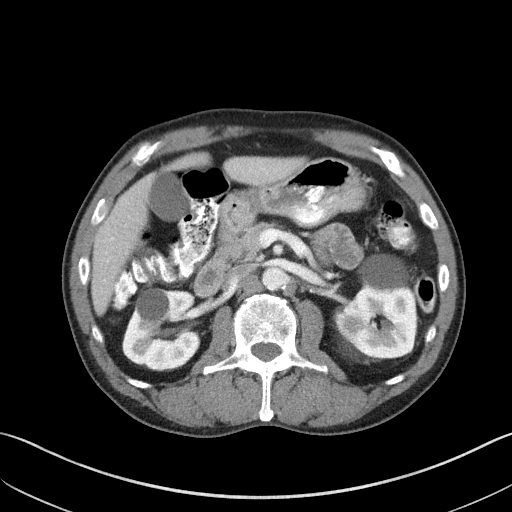
[im 70/89  soft-tissue]
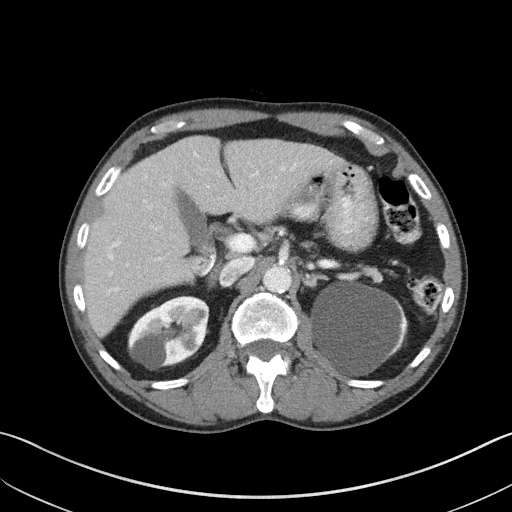
[im 70/89  lung]
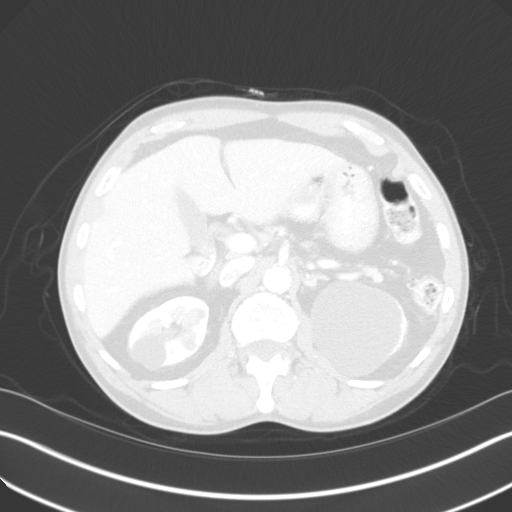
[im 75/89  soft-tissue]
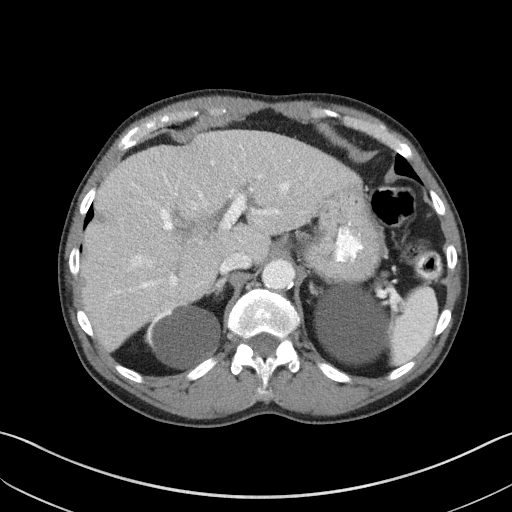
[im 75/89  lung]
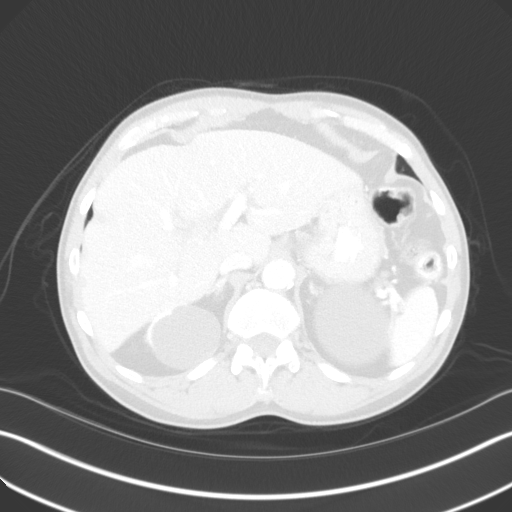
[im 79/89  lung]
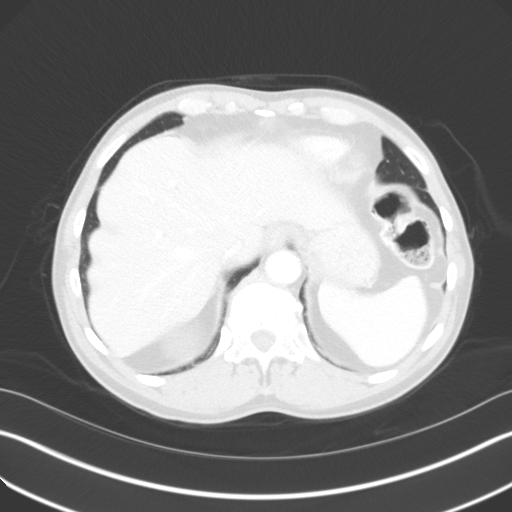
[im 84/89  soft-tissue]
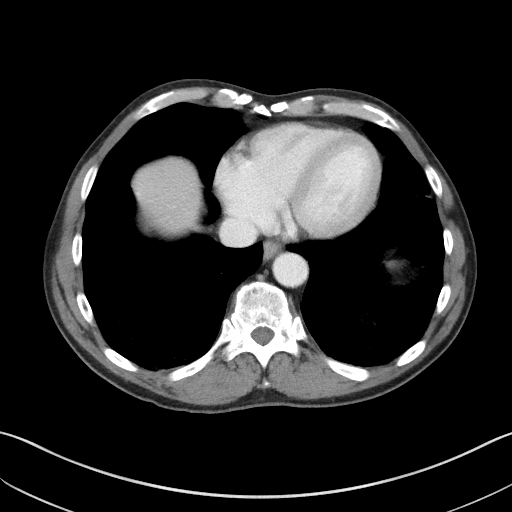
[im 84/89  lung]
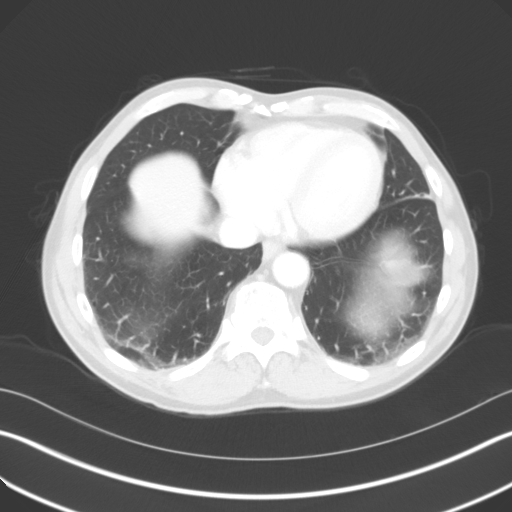

[14 of 32 positions shown; findings below may reference images not displayed]

FINDINGS: Lower chest:  Minimal dependent atelectasis at both lung bases.

Hepatobiliary: Mild diffuse low density of the liver relative to the
spleen. Mildly dilated common hepatic duct and intrahepatic ducts.
The common hepatic duct measures 10.6 mm in maximum diameter. The
common bile duct is normal in caliber. No obstructing stone or mass
seen. Normal appearing gallbladder.

Pancreas: Atrophy of the neck, body and tail of the pancreas. The
head of the pancreas has a normal appearance. No mass visualized.

Spleen: Within normal limits in size and appearance.

Adrenals/Urinary Tract: Normal appearing adrenal glands. Multiple
bilateral renal cysts. No urinary tract calculi or hydronephrosis.
Normal appearing urinary bladder.

Stomach/Bowel: Multiple colonic diverticula without evidence of
diverticulitis. No gastric or small bowel abnormalities. Normal
appearing appendix.

Vascular/Lymphatic: Atheromatous arterial calcifications, including
the abdominal aorta. No enlarged lymph nodes.

Reproductive: Mildly enlarged prostate gland.

Other: None.

Musculoskeletal: Lumbar and lower thoracic spine degenerative
changes.
IMPRESSION: 1. No acute abnormality.
2. Mild diffuse hepatic steatosis.
3. Mild dilatation of the common hepatic duct and intrahepatic
ducts. This could be due to a nonvisualized common duct stone or
stricture. This is of doubtful clinical significance in view normal
liver function tests on 06/16/2016.
4. Colonic diverticulosis.
5. Aortic atherosclerosis.
6. Atrophy of the neck, body and tail of the pancreas. This is of
unknown significance in the absence of a visualized pancreatic mass.

## 2017-05-09 ENCOUNTER — Ambulatory Visit: Payer: Self-pay | Admitting: Family Medicine

## 2017-06-11 ENCOUNTER — Encounter: Payer: Self-pay | Admitting: Family Medicine

## 2017-06-11 ENCOUNTER — Ambulatory Visit (INDEPENDENT_AMBULATORY_CARE_PROVIDER_SITE_OTHER): Payer: BLUE CROSS/BLUE SHIELD | Admitting: Family Medicine

## 2017-06-11 VITALS — BP 120/82 | HR 67 | Temp 98.5°F | Wt 156.0 lb

## 2017-06-11 DIAGNOSIS — E785 Hyperlipidemia, unspecified: Secondary | ICD-10-CM | POA: Diagnosis not present

## 2017-06-11 DIAGNOSIS — Z1159 Encounter for screening for other viral diseases: Secondary | ICD-10-CM | POA: Diagnosis not present

## 2017-06-11 DIAGNOSIS — Z86718 Personal history of other venous thrombosis and embolism: Secondary | ICD-10-CM

## 2017-06-11 DIAGNOSIS — Z720 Tobacco use: Secondary | ICD-10-CM | POA: Diagnosis not present

## 2017-06-11 DIAGNOSIS — E119 Type 2 diabetes mellitus without complications: Secondary | ICD-10-CM

## 2017-06-11 DIAGNOSIS — I10 Essential (primary) hypertension: Secondary | ICD-10-CM | POA: Diagnosis not present

## 2017-06-11 LAB — COMPREHENSIVE METABOLIC PANEL
ALK PHOS: 66 U/L (ref 39–117)
ALT: 15 U/L (ref 0–53)
AST: 15 U/L (ref 0–37)
Albumin: 4.2 g/dL (ref 3.5–5.2)
BILIRUBIN TOTAL: 0.5 mg/dL (ref 0.2–1.2)
BUN: 18 mg/dL (ref 6–23)
CALCIUM: 9.8 mg/dL (ref 8.4–10.5)
CO2: 25 mEq/L (ref 19–32)
Chloride: 104 mEq/L (ref 96–112)
Creatinine, Ser: 1.11 mg/dL (ref 0.40–1.50)
GFR: 84.49 mL/min (ref >60.00)
GLUCOSE: 106 mg/dL — AB (ref 70–99)
POTASSIUM: 4.1 meq/L (ref 3.5–5.1)
Sodium: 136 mEq/L (ref 135–145)
TOTAL PROTEIN: 7 g/dL (ref 6.0–8.3)

## 2017-06-11 LAB — HEMOGLOBIN A1C: Hgb A1c MFr Bld: 6.3 % (ref 4.6–6.5)

## 2017-06-11 LAB — LDL CHOLESTEROL, DIRECT: LDL DIRECT: 181 mg/dL

## 2017-06-11 NOTE — Assessment & Plan Note (Signed)
Check LDL cholesterol. Encouraged to take his Lipitor daily.

## 2017-06-11 NOTE — Assessment & Plan Note (Signed)
Encouraged smoking cessation though patient is not ready. He'll inform us if he decides to quit.

## 2017-06-11 NOTE — Assessment & Plan Note (Signed)
At goal.  Continue current medications.  Check labs  

## 2017-06-11 NOTE — Patient Instructions (Signed)
Nice to see you. We will obtain lab work today and contact you with the results. If you develop recurrent swelling or new swelling or pain in your legs please be reevaluated.

## 2017-06-11 NOTE — Assessment & Plan Note (Signed)
No recurrence of symptoms since coming off of Eliquis. He will monitor for recurrence.

## 2017-06-11 NOTE — Assessment & Plan Note (Signed)
Check A1c.  Continue metformin. 

## 2017-06-11 NOTE — Progress Notes (Signed)
  Michael Rumps, MD Phone: [phone]  Michael Roy is a 71 y.o. male who presents today for f/u.  HYPERTENSION Disease Monitoring: Blood pressure range-not checking Chest pain- no      Dyspnea- no Medications: Compliance- taking amlodipine, lisinopril, HCTZ   Edema- no  DIABETES Disease Monitoring: Blood Sugar ranges-not checking Polyuria/phagia/dipsia- no      Optho- UTD Medications: Compliance- taking metformin Hypoglycemic symptoms- no  HYPERLIPIDEMIA Disease Monitoring: See symptoms for Hypertension Medications: Compliance- taking lipitor most days  Right upper quadrant pain- no  Muscle aches- no  History of DVT: Patient came off of Eliquis a couple of months ago. Has followed with vascular surgery. He has had no swelling. Notes rare twinge that will last a second and goes away. No persistent pain.  PMH: Smoker. Not interested in quitting.   ROS see history of present illness  Objective  Physical Exam Vitals:   06/11/17 1428  BP: 120/82  Pulse: 67  Temp: 98.5 F (36.9 C)    BP Readings from Last 3 Encounters:  06/11/17 120/82  03/19/17 133/79  12/22/16 110/62   Wt Readings from Last 3 Encounters:  06/11/17 156 lb (70.8 kg)  03/19/17 159 lb (72.1 kg)  12/22/16 159 lb 3.2 oz (72.2 kg)    Physical Exam  Constitutional: No distress.  Cardiovascular: Normal rate, regular rhythm and normal heart sounds.   Pulmonary/Chest: Effort normal and breath sounds normal.  Musculoskeletal: He exhibits no edema.  No calf swelling, calf tenderness, or cords palpated  Neurological: He is alert. Gait normal.  Skin: Skin is warm and dry. He is not diaphoretic.     Assessment/Plan: Please see individual problem list.  Hypertension At goal. Continue current medications. Check labs.  History of DVT (deep vein thrombosis) No recurrence of symptoms since coming off of Eliquis. He will monitor for recurrence.  Diabetes type 2, controlled (HCC) Check A1c.  Continue metformin.  Hyperlipidemia Check LDL cholesterol. Encouraged to take his Lipitor daily.  Tobacco abuse Encouraged smoking cessation though patient is not ready. He'll inform us if he decides to quit.   Orders Placed This Encounter  Procedures  . LDL cholesterol, direct  . Comp Met (CMET)  . HgB A1c  . Hepatitis C Antibody   Michael Rumps, MD [place]

## 2017-06-12 LAB — HEPATITIS C ANTIBODY: HCV Ab: NEGATIVE

## 2017-07-09 ENCOUNTER — Telehealth: Payer: Self-pay | Admitting: *Deleted

## 2017-07-09 NOTE — Telephone Encounter (Signed)
Left message for patient to notify them that it is time to schedule annual low dose lung cancer screening CT scan. Instructed patient to call back to verify information prior to the scan being scheduled.  

## 2017-07-18 ENCOUNTER — Telehealth: Payer: Self-pay | Admitting: *Deleted

## 2017-07-18 DIAGNOSIS — Z122 Encounter for screening for malignant neoplasm of respiratory organs: Secondary | ICD-10-CM

## 2017-07-18 DIAGNOSIS — Z87891 Personal history of nicotine dependence: Secondary | ICD-10-CM

## 2017-07-18 NOTE — Telephone Encounter (Signed)
CT appt schd for 08/10/17 @ 2 pm @ [facility], per 07/18/17  schd msg/Shawn Perkins. Left message on v/m and appointment reminder mailed to patient.

## 2017-07-18 NOTE — Telephone Encounter (Signed)
Notified patient that annual lung cancer screening low dose CT scan is due currently or will be in near future. Confirmed that patient is within the age range of 55-77, and asymptomatic, (no signs or symptoms of lung cancer). Patient denies illness that would prevent curative treatment for lung cancer if found. Verified smoking history, (current, 31.5 pack year). The shared decision making visit was done 02/24/15. Patient is agreeable for CT scan being scheduled.

## 2017-07-23 ENCOUNTER — Ambulatory Visit (INDEPENDENT_AMBULATORY_CARE_PROVIDER_SITE_OTHER): Payer: BLUE CROSS/BLUE SHIELD | Admitting: Vascular Surgery

## 2017-07-23 ENCOUNTER — Telehealth: Payer: Self-pay | Admitting: Family Medicine

## 2017-07-23 MED ORDER — AMLODIPINE BESYLATE 5 MG PO TABS: 5.0000 mg | ORAL_TABLET | Freq: Every day | ORAL | 2 refills | Status: DC

## 2017-07-23 MED ORDER — LISINOPRIL-HYDROCHLOROTHIAZIDE 20-25 MG PO TABS: 1.0000 | ORAL_TABLET | Freq: Every day | ORAL | 2 refills | Status: DC

## 2017-07-23 NOTE — Telephone Encounter (Signed)
Sent to pharmacy 

## 2017-07-23 NOTE — Telephone Encounter (Signed)
Pt called requesting a refill on his amLODipine (NORVASC) 5 MG tablet, and lisinopril-hydrochlorothiazide (PRINZIDE,ZESTORETIC) 20-25 MG tablet. Please advise, thank you!  [place] [address] [place]

## 2017-08-10 ENCOUNTER — Ambulatory Visit
Admission: RE | Admit: 2017-08-10 | Discharge: 2017-08-10 | Disposition: A | Discharge status: Home / Self Care | Payer: BLUE CROSS/BLUE SHIELD | Source: Ambulatory Visit | Attending: Oncology | Admitting: Oncology

## 2017-08-10 DIAGNOSIS — Z87891 Personal history of nicotine dependence: Secondary | ICD-10-CM | POA: Insufficient documentation

## 2017-08-10 DIAGNOSIS — Z122 Encounter for screening for malignant neoplasm of respiratory organs: Secondary | ICD-10-CM | POA: Diagnosis present

## 2017-08-10 DIAGNOSIS — I700 Atherosclerosis of aorta: Secondary | ICD-10-CM | POA: Insufficient documentation

## 2017-08-10 DIAGNOSIS — J439 Emphysema, unspecified: Secondary | ICD-10-CM | POA: Insufficient documentation

## 2017-08-13 ENCOUNTER — Encounter: Payer: Self-pay | Admitting: *Deleted

## 2017-08-13 ENCOUNTER — Encounter: Payer: Self-pay | Admitting: Family Medicine

## 2017-08-13 DIAGNOSIS — I2510 Atherosclerotic heart disease of native coronary artery without angina pectoris: Secondary | ICD-10-CM | POA: Insufficient documentation

## 2017-08-21 ENCOUNTER — Telehealth: Payer: Self-pay

## 2017-08-21 NOTE — Telephone Encounter (Signed)
Sent letter to call office for ct results

## 2017-08-27 ENCOUNTER — Encounter (INDEPENDENT_AMBULATORY_CARE_PROVIDER_SITE_OTHER): Payer: Self-pay | Admitting: Vascular Surgery

## 2017-08-27 ENCOUNTER — Ambulatory Visit (INDEPENDENT_AMBULATORY_CARE_PROVIDER_SITE_OTHER): Payer: BLUE CROSS/BLUE SHIELD | Admitting: Vascular Surgery

## 2017-08-27 VITALS — BP 126/80 | HR 68 | Resp 16 | Ht 70.0 in | Wt 155.0 lb

## 2017-08-27 DIAGNOSIS — E119 Type 2 diabetes mellitus without complications: Secondary | ICD-10-CM

## 2017-08-27 DIAGNOSIS — I10 Essential (primary) hypertension: Secondary | ICD-10-CM

## 2017-08-27 DIAGNOSIS — I25118 Atherosclerotic heart disease of native coronary artery with other forms of angina pectoris: Secondary | ICD-10-CM

## 2017-08-27 DIAGNOSIS — Z86718 Personal history of other venous thrombosis and embolism: Secondary | ICD-10-CM

## 2017-08-29 NOTE — Progress Notes (Signed)
MRN : [id number]  Michael Roy is a 71 y.o. ([birth year]) male who presents with chief complaint of  Chief Complaint  Patient presents with  . Follow-up    4 month no studies f/u  .  History of Present Illness: The patient presents to the office for evaluation of DVT.  DVT was identified at [facility] by Duplex ultrasound on September 14, 2016.  The initial symptoms were pain and swelling in the lower extremity.  This occurred as an outpatient and he was seen in the ER and started on Eliquis.  Since his last visit with me he has been doing well he has continued his aliquots  The patient notes the leg is not particularly painful with dependency and swells just a little.  Symptoms are much better with elevation.  The patient notes minimal edema in the morning which steadily worsens throughout the day.    The patient has been using compression therapy at this point with excellent result.  No SOB or pleuritic chest pains.  No cough or hemoptysis.  No blood per rectum or blood in any sputum.  No excessive bruising per the patient.   Current Meds  Medication Sig  . amLODipine (NORVASC) 5 MG tablet Take 1 tablet (5 mg total) by mouth daily.  Marland Kitchen apixaban (ELIQUIS) 5 MG TABS tablet Take by mouth.  Marland Kitchen atorvastatin (LIPITOR) 40 MG tablet Take 1 tablet (40 mg total) by mouth daily.  Marland Kitchen BAYER CONTOUR NEXT TEST test strip USE AS DIRECTED  . lisinopril-hydrochlorothiazide (PRINZIDE,ZESTORETIC) 20-25 MG tablet Take 1 tablet by mouth daily.  . metFORMIN (GLUCOPHAGE) 500 MG tablet TAKE ONE TABLET BY MOUTH TWICE DAILY WITH MEALS  . MICROLET LANCETS MISC USE 1 LANCET TWICE DAILY  . omeprazole (PRILOSEC) 40 MG capsule Take 1 capsule (40 mg total) by mouth 2 (two) times daily before a meal. Omeprazole 40 mg by mouth twice daily (30 minutes before breakfast and dinner.)  . traMADol (ULTRAM) 50 MG tablet Take 1 tablet (50 mg total) by mouth every 8 (eight) hours as needed.  . [DISCONTINUED]  bismuth-metronidazole-tetracycline (PYLERA) 140-125-125 MG capsule Take 3 capsules by mouth 4 (four) times daily -  before meals and at bedtime.   Current Facility-Administered Medications for the 08/27/17 encounter (Office Visit) with [name], [name], MD  Medication  . 0.9 %  sodium chloride infusion    Past Medical History:  Diagnosis Date  . Aortic atherosclerosis ([place])   . Arthritis   . Cataract    just starting  . Cervical spine fracture ([place])   . Diabetes mellitus    borderline  . Diverticulosis   . Dry cough    smokers cough per pt.   . Eating disorder   . Headache(784.0)   . Heart murmur   . Hepatic steatosis   . Hyperlipidemia   . Hypertension   . Personal history of tobacco use, presenting hazards to health 05/30/2016  . Tubular adenoma of colon     Past Surgical History:  Procedure Laterality Date  . COLONOSCOPY  07-3012  . POLYPECTOMY  07-2012   18 polyps removed   . stitches Left    calf area as a child     Social History Social History  Substance Use Topics  . Smoking status: Current Every Day Smoker    Packs/day: 1.00    Types: Cigarettes  . Smokeless tobacco: Never Used  . Alcohol use 4.2 oz/week    7 Cans of beer per week  Comment: beer    Family History Family History  Problem Relation Age of Onset  . Hypertension Mother   . Diabetes Mother   . Heart disease Mother   . Hypertension Father   . Cancer Sister        colon  . Heart disease Sister   . Stroke Sister   . Colon cancer Sister   . Alcohol abuse Brother   . Cancer Brother   . Sudden death Brother   . Colon polyps Brother   . Colon polyps Brother   . Colon polyps Brother   . Rectal cancer Neg Hx   . Stomach cancer Neg Hx     No Known Allergies   REVIEW OF SYSTEMS (Negative unless checked)  Constitutional: [] Weight loss  [] Fever  [] Chills Cardiac: [] Chest pain   [] Chest pressure   [] Palpitations   [] Shortness of breath when laying flat   [] Shortness of breath with  exertion. Vascular:  [] Pain in legs with walking   [] Pain in legs at rest  [x] History of DVT   [] Phlebitis   [x] Swelling in legs   [] Varicose veins   [] Non-healing ulcers Pulmonary:   [] Uses home oxygen   [] Productive cough   [] Hemoptysis   [] Wheeze  [] COPD   [] Asthma Neurologic:  [] Dizziness   [] Seizures   [] History of stroke   [] History of TIA  [] Aphasia   [] Vissual changes   [] Weakness or numbness in arm   [] Weakness or numbness in leg Musculoskeletal:   [] Joint swelling   [] Joint pain   [] Low back pain Hematologic:  [] Easy bruising  [] Easy bleeding   [] Hypercoagulable state   [] Anemic Gastrointestinal:  [] Diarrhea   [] Vomiting  [] Gastroesophageal reflux/heartburn   [] Difficulty swallowing. Genitourinary:  [] Chronic kidney disease   [] Difficult urination  [] Frequent urination   [] Blood in urine Skin:  [] Rashes   [] Ulcers  Psychological:  [] History of anxiety   []  History of major depression.  Physical Examination  Vitals:   08/27/17 1623  BP: 126/80  Pulse: 68  Resp: 16  Weight: 70.3 kg (155 lb)  Height: 5\' 10"  (1.778 m)   Body mass index is 22.24 kg/m. Gen: WD/WN, NAD Head: [place]/AT, No temporalis wasting.  Ear/Nose/Throat: Hearing grossly intact, nares w/o erythema or drainage Eyes: PER, EOMI, sclera nonicteric.  Neck: Supple, no large masses.   Pulmonary:  Good air movement, no audible wheezing bilaterally, no use of accessory muscles.  Cardiac: RRR, no JVD Vascular: bilateral lower extremities with 1+ pitting edema no significant venous skin changes noted. Vessel Right Left  Radial Palpable Palpable  Gastrointestinal: Non-distended. No guarding/no peritoneal signs.  Musculoskeletal: M/S 5/5 throughout.  No deformity or atrophy.  Neurologic: CN 2-12 intact. Symmetrical.  Speech is fluent. Motor exam as listed above. Psychiatric: Judgment intact, Mood & affect appropriate for pt's clinical situation. Dermatologic: No rashes or ulcers noted.  No changes consistent with  cellulitis. Lymph : No lichenification or skin changes of chronic lymphedema.  CBC Lab Results  Component Value Date   WBC 8.5 09/14/2016   HGB 14.5 09/14/2016   HCT 41.7 09/14/2016   MCV 93.9 09/14/2016   PLT 185 09/14/2016    BMET    Component Value Date/Time   NA 136 06/11/2017 1444   K 4.1 06/11/2017 1444   CL 104 06/11/2017 1444   CO2 25 06/11/2017 1444   GLUCOSE 106 (H) 06/11/2017 1444   BUN 18 06/11/2017 1444   BUN 18 12/26/2016   CREATININE 1.11 06/11/2017 1444   CREATININE 1.39 (  H) 06/16/2016 1544   CALCIUM 9.8 06/11/2017 1444   GFRNONAA 44 (L) 09/14/2016 1604   GFRAA 51 (L) 09/14/2016 1604   CrCl cannot be calculated (Patient's most recent lab result is older than the maximum 21 days allowed.).  COAG Lab Results  Component Value Date   INR 0.96 09/14/2016    Radiology Ct Chest Lung Cancer Screening Low Dose Wo Contrast  Result Date: 08/10/2017 CLINICAL DATA:  71 year old male current smoker, with 32 pack-year history of smoking, for follow-up lung cancer screening EXAM: CT CHEST WITHOUT CONTRAST LOW-DOSE FOR LUNG CANCER SCREENING TECHNIQUE: Multidetector CT imaging of the chest was performed following the standard protocol without IV contrast. COMPARISON:  Low-dose lung cancer screening CT chest dated 07/07/2016 FINDINGS: Cardiovascular: Heart is normal in size.  No pericardial effusion. No evidence of thoracic aortic aneurysm. Atherosclerotic calcifications aortic arch. Mild three-vessel coronary atherosclerosis. Mediastinum/Nodes: No suspicious mediastinal lymphadenopathy. Visualized thyroid is unremarkable. Lungs/Pleura: Mild right apical pleural-parenchymal scarring. Mild centrilobular and paraseptal emphysematous changes, upper lung predominant. No focal consolidation. Scattered small bilateral pulmonary nodules measuring up to 2.4 mm. No pleural effusion or pneumothorax. Upper Abdomen: Visualized upper abdomen is notable for bilateral renal cysts, measuring up  to 7.3 cm on the left. Musculoskeletal: Mild degenerative changes of the lower cervical and upper thoracic spine. IMPRESSION: Lung-RADS 2, benign appearance or behavior. Continue annual screening with low-dose chest CT without contrast in 12 months. Aortic Atherosclerosis (ICD10-I70.0) and Emphysema (ICD10-J43.9). Electronically Signed   By: Julian Hy M.D.   On: 08/10/2017 15:42    Assessment/Plan 1. History of DVT (deep vein thrombosis) Patient is doing well with his anticoagulation will continue this. He states his primary is following this. He will continue compression on a daily basis. We also discussed elevation frequently exercise and avoidance of prolonged dependence. Patient will follow-up with me when necessary  2. Coronary artery disease of native artery of native heart with stable angina pectoris (HCC) Continue cardiac and antihypertensive medications as already ordered and reviewed, no changes at this time.  Continue statin as ordered and reviewed, no changes at this time  Nitrates PRN for chest pain   3. Essential hypertension Continue antihypertensive medications as already ordered, these medications have been reviewed and there are no changes at this time.   4. Controlled type 2 diabetes mellitus without complication, without long-term current use of insulin ([place]) Continue hypoglycemic medications as already ordered, these medications have been reviewed and there are no changes at this time.  Hgb A1C to be monitored as already arranged by primary service     Hortencia Pilar, MD  08/29/2017 12:08 PM

## 2017-09-11 ENCOUNTER — Ambulatory Visit (INDEPENDENT_AMBULATORY_CARE_PROVIDER_SITE_OTHER): Payer: BLUE CROSS/BLUE SHIELD | Admitting: Family Medicine

## 2017-09-11 ENCOUNTER — Encounter: Payer: Self-pay | Admitting: Family Medicine

## 2017-09-11 VITALS — BP 120/86 | HR 68 | Temp 98.1°F | Wt 157.8 lb

## 2017-09-11 DIAGNOSIS — I2510 Atherosclerotic heart disease of native coronary artery without angina pectoris: Secondary | ICD-10-CM

## 2017-09-11 DIAGNOSIS — R319 Hematuria, unspecified: Secondary | ICD-10-CM

## 2017-09-11 DIAGNOSIS — Z720 Tobacco use: Secondary | ICD-10-CM

## 2017-09-11 DIAGNOSIS — E119 Type 2 diabetes mellitus without complications: Secondary | ICD-10-CM | POA: Diagnosis not present

## 2017-09-11 DIAGNOSIS — E782 Mixed hyperlipidemia: Secondary | ICD-10-CM | POA: Diagnosis not present

## 2017-09-11 DIAGNOSIS — J439 Emphysema, unspecified: Secondary | ICD-10-CM | POA: Insufficient documentation

## 2017-09-11 DIAGNOSIS — Z86718 Personal history of other venous thrombosis and embolism: Secondary | ICD-10-CM

## 2017-09-11 DIAGNOSIS — I10 Essential (primary) hypertension: Secondary | ICD-10-CM | POA: Diagnosis not present

## 2017-09-11 DIAGNOSIS — R109 Unspecified abdominal pain: Secondary | ICD-10-CM

## 2017-09-11 LAB — POCT URINALYSIS DIPSTICK
BILIRUBIN UA: NEGATIVE
Glucose, UA: NEGATIVE
KETONES UA: NEGATIVE
Leukocytes, UA: NEGATIVE
Nitrite, UA: NEGATIVE
Protein, UA: NEGATIVE
SPEC GRAV UA: 1.02 (ref 1.010–1.025)
UROBILINOGEN UA: 1 U/dL
pH, UA: 7 (ref 5.0–8.0)

## 2017-09-11 LAB — COMPREHENSIVE METABOLIC PANEL
ALT: 19 U/L (ref 0–53)
AST: 16 U/L (ref 0–37)
Albumin: 4.3 g/dL (ref 3.5–5.2)
Alkaline Phosphatase: 74 U/L (ref 39–117)
BUN: 18 mg/dL (ref 6–23)
CHLORIDE: 104 meq/L (ref 96–112)
CO2: 28 meq/L (ref 19–32)
Calcium: 9.5 mg/dL (ref 8.4–10.5)
Creatinine, Ser: 1.15 mg/dL (ref 0.40–1.50)
GFR: 81.05 mL/min (ref >60.00)
GLUCOSE: 92 mg/dL (ref 70–99)
POTASSIUM: 4.2 meq/L (ref 3.5–5.1)
SODIUM: 138 meq/L (ref 135–145)
Total Bilirubin: 0.5 mg/dL (ref 0.2–1.2)
Total Protein: 7.2 g/dL (ref 6.0–8.3)

## 2017-09-11 LAB — HEMOGLOBIN A1C: HEMOGLOBIN A1C: 6.6 % — AB (ref 4.6–6.5)

## 2017-09-11 LAB — LDL CHOLESTEROL, DIRECT: Direct LDL: 132 mg/dL

## 2017-09-11 NOTE — Patient Instructions (Signed)
Nice to see you. Check lab work today and contact you with the results. Please watch the area of the abdominal discomfort that you had and if it recurs or becomes persistent please let us know.

## 2017-09-11 NOTE — Assessment & Plan Note (Signed)
Repeat urinalysis. Did have renal cysts on CT scan.

## 2017-09-11 NOTE — Assessment & Plan Note (Signed)
Noted on CT chest. Discussed risk factor modification. Encouraged smoking cessation.

## 2017-09-11 NOTE — Assessment & Plan Note (Signed)
Check LDL today. He reports he is more frequently taking his Lipitor.

## 2017-09-11 NOTE — Assessment & Plan Note (Signed)
Encouraged smoking cessation 

## 2017-09-11 NOTE — Assessment & Plan Note (Signed)
Has been well-controlled. Recheck today.

## 2017-09-11 NOTE — Progress Notes (Signed)
  [name], MD Phone: [phone]  Ponciano Shealy is a 71 y.o. male who presents today for follow-up.  HYPERTENSION Disease Monitoring: Blood pressure range-not checking Chest pain- no      Dyspnea- no Medications: Compliance- taking lisinopril, hctz, amlodipine   Edema- no  DIABETES Disease Monitoring: Blood Sugar ranges-not checking Polyuria/phagia/dipsia- no      Optho- UTD Medications: Compliance- taking metformin Hypoglycemic symptoms- no  HYPERLIPIDEMIA Disease Monitoring: See symptoms for Hypertension Medications: Compliance- taking lipitor more frequently Right upper quadrant pain- no  Muscle aches- no  Patient does note on a couple of occasions he has had some left-sided abdominal discomfort that went away with eating. Last time was 2 weeks ago. It felt as though he needed to have a bowel movement and then he ate and that sensation went away. No other symptoms with this.  Patient recently saw vascular surgery for follow-up of his DVT. He has not had any recurrent swelling. They released him.  He had a history of hematuria previously. Has not noted any recurrence. Asymptomatic.  He did recently have CT scanning of his chest for lung cancer screening. Recommended 1 year follow-up. They did find emphysematous changes. He does occasionally cough though has no trouble breathing. He does not want PFTs. They did find CAD. We are currently working on risk factor modification. He is asymptomatic. Renal cyst noted as well.  LGX:QJJHER. Not interested in quitting.  ROS see history of present illness  Objective  Physical Exam Vitals:   09/11/17 1455  BP: 120/86  Pulse: 68  Temp: 98.1 F (36.7 C)  SpO2: 95%    BP Readings from Last 3 Encounters:  09/11/17 120/86  08/27/17 126/80  06/11/17 120/82   Wt Readings from Last 3 Encounters:  09/11/17 157 lb 12.8 oz (71.6 kg)  08/27/17 155 lb (70.3 kg)  08/10/17 145 lb (65.8 kg)    Physical Exam    Constitutional: No distress.  HENT:  Head: Normocephalic and atraumatic.  Cardiovascular: Normal rate, regular rhythm and normal heart sounds.   Pulmonary/Chest: Effort normal and breath sounds normal.  Abdominal: Soft. Bowel sounds are normal. He exhibits no distension. There is no tenderness. There is no rebound and no guarding.  Musculoskeletal: He exhibits no edema.  Neurological: He is alert. Gait normal.  Skin: Skin is warm and dry. He is not diaphoretic.     Assessment/Plan: Please see individual problem list.  Hematuria Repeat urinalysis. Did have renal cysts on CT scan.  Tobacco abuse Encouraged smoking cessation.  Diabetes type 2, controlled ([place]) Has been well-controlled. Recheck today.  CAD (coronary artery disease) Noted on CT chest. Discussed risk factor modification. Encouraged smoking cessation.  History of DVT (deep vein thrombosis) No recurrence. Continue to monitor. Given return precautions.  Hyperlipidemia Check LDL today. He reports he is more frequently taking his Lipitor.  Emphysema lung ([place]) Noted on CT chest. He does smoke. Encouraged smoking cessation. He does cough some though otherwise asymptomatic. He defers treatment and PFTs at this time.  Left lateral abdominal pain Infrequent discomfort. Resolves with eating. Possible desired a bowel movement when this occurs. Could be constipation related. Asymptomatic currently. If becomes more consistent or persistent he will let us know.   Orders Placed This Encounter  Procedures  . HgB A1c  . Comp Met (CMET)  . LDL cholesterol, direct  . Urine Microscopic Only  . POCT Urinalysis Dipstick     [name], MD [place]

## 2017-09-11 NOTE — Assessment & Plan Note (Signed)
No recurrence. Continue to monitor. Given return precautions.

## 2017-09-11 NOTE — Assessment & Plan Note (Signed)
Noted on CT chest. He does smoke. Encouraged smoking cessation. He does cough some though otherwise asymptomatic. He defers treatment and PFTs at this time.

## 2017-09-11 NOTE — Assessment & Plan Note (Signed)
Infrequent discomfort. Resolves with eating. Possible desired a bowel movement when this occurs. Could be constipation related. Asymptomatic currently. If becomes more consistent or persistent he will let us know.

## 2017-09-12 LAB — URINALYSIS, MICROSCOPIC ONLY
RBC / HPF: NONE SEEN
WBC, UA: NONE SEEN

## 2017-10-22 ENCOUNTER — Other Ambulatory Visit: Payer: Self-pay | Admitting: Family Medicine

## 2017-10-24 NOTE — Telephone Encounter (Signed)
Left message to return call, ok for PEC to speak to patient to ask how many days and tablets of tramadol he has left.

## 2017-10-29 ENCOUNTER — Telehealth: Payer: Self-pay | Admitting: Family Medicine

## 2017-10-29 NOTE — Telephone Encounter (Signed)
Called patient, no answer. Left message regarding his refill on Tramadol, for him to return call.

## 2017-10-30 NOTE — Telephone Encounter (Signed)
Please contact the patient and see what he is taking this for and how often he is taking it.  Thanks.

## 2017-10-30 NOTE — Telephone Encounter (Signed)
Last OV 09/11/17 last filled 02/19/17 90 1rf

## 2017-11-01 NOTE — Telephone Encounter (Signed)
Patient takes this for hip pain and takes 1 or 2 a week as needed

## 2017-11-01 NOTE — Telephone Encounter (Signed)
Refill given. Please fax. 

## 2017-11-02 NOTE — Telephone Encounter (Signed)
faxed

## 2018-01-04 ENCOUNTER — Ambulatory Visit (HOSPITAL_COMMUNITY): Payer: BLUE CROSS/BLUE SHIELD

## 2018-01-04 ENCOUNTER — Encounter (HOSPITAL_COMMUNITY): Payer: Self-pay

## 2018-01-25 ENCOUNTER — Telehealth: Payer: Self-pay | Admitting: Family Medicine

## 2018-01-25 ENCOUNTER — Other Ambulatory Visit: Payer: Self-pay | Admitting: Family Medicine

## 2018-01-25 NOTE — Telephone Encounter (Signed)
Patient called, left VM to call back to clarify the pharmacy to send the Lipitor refill. [facility] [address] is not listed on his list of pharmacies.

## 2018-01-25 NOTE — Telephone Encounter (Signed)
Copied from [place] [id number]. Topic: Quick Communication - Rx Refill/Question >> Jan 25, 2018 11:51 AM Synthia Innocent wrote: Medication: atorvastatin (LIPITOR) 40 MG tablet   Has the patient contacted their pharmacy? Yes.     (Agent: If no, request that the patient contact the pharmacy for the refill.)   Preferred Pharmacy (with phone number or street name): [facility] on [address]   Agent: Please be advised that RX refills may take up to 3 business days. We ask that you follow-up with your pharmacy.

## 2018-02-21 ENCOUNTER — Encounter: Payer: Self-pay | Admitting: *Deleted

## 2018-02-27 ENCOUNTER — Encounter: Payer: Self-pay | Admitting: Internal Medicine

## 2018-03-12 ENCOUNTER — Ambulatory Visit (INDEPENDENT_AMBULATORY_CARE_PROVIDER_SITE_OTHER): Payer: BLUE CROSS/BLUE SHIELD | Admitting: Family Medicine

## 2018-03-12 ENCOUNTER — Other Ambulatory Visit: Payer: Self-pay

## 2018-03-12 ENCOUNTER — Encounter: Payer: Self-pay | Admitting: Family Medicine

## 2018-03-12 VITALS — BP 130/84 | HR 78 | Temp 98.1°F | Ht 70.0 in | Wt 160.2 lb

## 2018-03-12 DIAGNOSIS — R200 Anesthesia of skin: Secondary | ICD-10-CM

## 2018-03-12 DIAGNOSIS — E782 Mixed hyperlipidemia: Secondary | ICD-10-CM | POA: Diagnosis not present

## 2018-03-12 DIAGNOSIS — R202 Paresthesia of skin: Secondary | ICD-10-CM

## 2018-03-12 DIAGNOSIS — I10 Essential (primary) hypertension: Secondary | ICD-10-CM | POA: Diagnosis not present

## 2018-03-12 DIAGNOSIS — E119 Type 2 diabetes mellitus without complications: Secondary | ICD-10-CM

## 2018-03-12 DIAGNOSIS — M25551 Pain in right hip: Secondary | ICD-10-CM | POA: Diagnosis not present

## 2018-03-12 MED ORDER — GABAPENTIN 100 MG PO CAPS: 100.0000 mg | ORAL_CAPSULE | Freq: Three times a day (TID) | ORAL | 3 refills | Status: DC

## 2018-03-12 MED ORDER — TRAMADOL HCL 50 MG PO TABS: 50.0000 mg | ORAL_TABLET | Freq: Three times a day (TID) | ORAL | 1 refills | Status: DC | PRN

## 2018-03-12 NOTE — Progress Notes (Signed)
Michael Rumps, MD Phone: [phone]  Cranston Koors is a 72 y.o. male who presents today for f/u  HYPERTENSION Disease Monitoring: Blood pressure range-not checking Chest pain- no      Dyspnea- no Medications: Compliance- taking amlodipine, lisinopril, HCTZ   Edema- no  DIABETES Disease Monitoring: Blood Sugar ranges-not checking Polyuria/phagia/dipsia- no     Optho- Reports UTD, we will request records Medications: Compliance- stopped metformin  HYPERLIPIDEMIA Disease Monitoring: See symptoms for Hypertension Medications: Compliance- taking lipitor Right upper quadrant pain- no  Muscle aches- no  Notes chronic numbness and tingling in his bilateral hands from his wrist down.  This is been going on for years and is unchanged.  He notes it is from a disc issue in his neck that he had evaluated previously with imaging.  No weakness.  He has chronic hip discomfort that he takes occasional tramadol for.  Describes it as stiffness.  Also some pain.  Does not drink alcohol when taking his tramadol.  No excessive drowsiness with this.  His wife reports he complained of some left leg discomfort that lasted briefly last week.  No associated swelling.  It went away on its own and has not recurred.     Social History   Tobacco Use  Smoking Status Current Every Day Smoker  . Packs/day: 1.00  . Types: Cigarettes  Smokeless Tobacco Never Used     ROS see history of present illness  Objective  Physical Exam Vitals:   03/12/18 1526  BP: 130/84  Pulse: 78  Temp: 98.1 F (36.7 C)  SpO2: 94%    BP Readings from Last 3 Encounters:  03/12/18 130/84  09/11/17 120/86  08/27/17 126/80   Wt Readings from Last 3 Encounters:  03/12/18 160 lb 3.2 oz (72.7 kg)  09/11/17 157 lb 12.8 oz (71.6 kg)  08/27/17 155 lb (70.3 kg)    Physical Exam  Constitutional: No distress.  Cardiovascular: Normal rate, regular rhythm and normal heart sounds.  Pulmonary/Chest: Effort normal  and breath sounds normal.  Musculoskeletal: He exhibits no edema.  No calf tenderness or cords bilaterally no midline neck tenderness, no midline neck step-off, no muscular neck tenderness, no discomfort on internal or external range of motion of either hip  Neurological: He is alert.  5/5 strength in bilateral biceps, triceps, grip, quads, hamstrings, plantar and dorsiflexion, sensation to light touch intact in bilateral UE and LE  Skin: Skin is warm and dry. He is not diaphoretic.   Diabetic Foot Exam - Simple   Simple Foot Form Diabetic Foot exam was performed with the following findings:  Yes 03/12/2018  4:06 PM  Visual Inspection No deformities, no ulcerations, no other skin breakdown bilaterally:  Yes Sensation Testing Intact to touch and monofilament testing bilaterally:  Yes Pulse Check Posterior Tibialis and Dorsalis pulse intact bilaterally:  Yes Comments      Assessment/Plan: Please see individual problem list.  Hypertension At goal.  Continue current regimen.  Check lab work.  Diabetes type 2, controlled ([place]) No longer on metformin at his choice.  Check A1c.  Request ophthalmology records.  He declines pneumonia vaccine.  Hyperlipidemia Continue Lipitor.  Check LDL.  Right hip pain Chronic issue.  Occasional tramadol use.  Counseled against taking this and drinking alcohol at the same time.  Discussed potential for drowsiness.  Numbness and tingling in left hand Notes chronic issues with numbness and tingling in bilateral hands.  Related to a disc issue.  Unchanged.  Discussed seeing a Psychologist, sport and exercise for  this though he declines.  We will trial him on gabapentin to see if that is beneficial.  Discussed the potential for drowsiness with this medication.  Very brief left leg pain last week that has resolved.  He will monitor for recurrence and let us know if it recurs.  Patient declines PSA for prostate cancer screening.  He canceled his colonoscopy as he does not want to  do this now given that his insurance will be changing.  Once he sorts his insurance out he will let us know and if he does not we will plan to discuss at his next visit.  I did discuss the risk of colon cancer for him is increased given his large number of polyps on his prior to colonoscopies.  I discussed the risk of death and developing colon cancer if had new polyps that did not get removed.  Orders Placed This Encounter  Procedures  . HgB A1c  . Comp Met (CMET)  . Direct LDL    Meds ordered this encounter  Medications  . gabapentin (NEURONTIN) 100 MG capsule    Sig: Take 1 capsule (100 mg total) by mouth 3 (three) times daily.    Dispense:  90 capsule    Refill:  3  . traMADol (ULTRAM) 50 MG tablet    Sig: Take 1 tablet (50 mg total) by mouth every 8 (eight) hours as needed.    Dispense:  30 tablet    Refill:  Ryan, MD [place]

## 2018-03-12 NOTE — Assessment & Plan Note (Signed)
Continue Lipitor.  Check LDL. 

## 2018-03-12 NOTE — Assessment & Plan Note (Signed)
Chronic issue.  Occasional tramadol use.  Counseled against taking this and drinking alcohol at the same time.  Discussed potential for drowsiness.

## 2018-03-12 NOTE — Assessment & Plan Note (Signed)
At goal.  Continue current regimen.  Check lab work. 

## 2018-03-12 NOTE — Assessment & Plan Note (Signed)
No longer on metformin at his choice.  Check A1c.  Request ophthalmology records.  He declines pneumonia vaccine.

## 2018-03-12 NOTE — Assessment & Plan Note (Signed)
Notes chronic issues with numbness and tingling in bilateral hands.  Related to a disc issue.  Unchanged.  Discussed seeing a surgeon for this though he declines.  We will trial him on gabapentin to see if that is beneficial.  Discussed the potential for drowsiness with this medication.

## 2018-03-12 NOTE — Patient Instructions (Addendum)
Nice to see you. We will check lab work today and contact you with the results. We will try placing you on gabapentin for the nerve pain in your hands.  This may make you drowsy so please be careful. If your leg starts to bother you again please let us know immediately.

## 2018-03-13 LAB — COMPREHENSIVE METABOLIC PANEL
ALK PHOS: 82 U/L (ref 39–117)
ALT: 26 U/L (ref 0–53)
AST: 23 U/L (ref 0–37)
Albumin: 4.3 g/dL (ref 3.5–5.2)
BILIRUBIN TOTAL: 0.7 mg/dL (ref 0.2–1.2)
BUN: 18 mg/dL (ref 6–23)
CALCIUM: 9.5 mg/dL (ref 8.4–10.5)
CO2: 28 mEq/L (ref 19–32)
CREATININE: 1.07 mg/dL (ref 0.40–1.50)
Chloride: 103 mEq/L (ref 96–112)
GFR: 87.95 mL/min (ref >60.00)
Glucose, Bld: 86 mg/dL (ref 70–99)
Potassium: 4.1 mEq/L (ref 3.5–5.1)
Sodium: 139 mEq/L (ref 135–145)
TOTAL PROTEIN: 7.3 g/dL (ref 6.0–8.3)

## 2018-03-13 LAB — HM DIABETES EYE EXAM

## 2018-03-13 LAB — HEMOGLOBIN A1C: Hgb A1c MFr Bld: 6.5 % (ref 4.6–6.5)

## 2018-03-13 LAB — LDL CHOLESTEROL, DIRECT: Direct LDL: 97 mg/dL

## 2018-03-14 ENCOUNTER — Encounter: Payer: Self-pay | Admitting: Family Medicine

## 2018-03-18 ENCOUNTER — Telehealth: Payer: Self-pay | Admitting: Family Medicine

## 2018-03-18 DIAGNOSIS — E785 Hyperlipidemia, unspecified: Secondary | ICD-10-CM

## 2018-03-18 MED ORDER — ATORVASTATIN CALCIUM 80 MG PO TABS: 80.0000 mg | ORAL_TABLET | Freq: Every day | ORAL | 1 refills | Status: DC

## 2018-03-18 NOTE — Telephone Encounter (Signed)
Patient states that he would like to stay on the lipitor and go with the increased dose.

## 2018-03-18 NOTE — Telephone Encounter (Signed)
Higher dose sent to pharmacy.  He will need a repeat LDL as well as hepatic function panel in 1 month.  I have placed orders.  Please get him scheduled.  Thanks.

## 2018-03-18 NOTE — Addendum Note (Signed)
Addended by: Leone Haven on: 03/18/2018 04:19 PM   Modules accepted: Orders

## 2018-03-18 NOTE — Telephone Encounter (Signed)
-----   Message from Curlene Labrum, RN sent at 03/18/2018  2:04 PM EDT ----- Pt returned call and lab result given to him with verbal understanding.  He is requesting a call back regarding the possibility of  increasing his Liptor or changing  to crestor.

## 2018-03-19 NOTE — Telephone Encounter (Signed)
Patient notified that RX for lipitor was sent in and has been scheduled for 1 month labs.

## 2018-04-01 ENCOUNTER — Ambulatory Visit: Payer: Self-pay | Admitting: *Deleted

## 2018-04-01 NOTE — Telephone Encounter (Signed)
  Reason for Disposition . General information question, no triage required and triager able to answer question  Answer Assessment - Initial Assessment Questions 1. REASON FOR CALL or QUESTION: "What is your reason for calling today?" or "How can I best help you?" or "What question do you have that I can help answer?"     Patient is concerned about the cost of his medication- he was paying $7.00. This Rx he paid $25.37. Call to pharmacy- they stated the reason it was so expensive is because he got a 90 day supply. Patient notified and also notified that he can get 30 day supply if he wants. He understands.  Protocols used: INFORMATION ONLY CALL-A-AH

## 2018-04-12 ENCOUNTER — Other Ambulatory Visit (INDEPENDENT_AMBULATORY_CARE_PROVIDER_SITE_OTHER): Payer: PPO

## 2018-04-12 DIAGNOSIS — E785 Hyperlipidemia, unspecified: Secondary | ICD-10-CM | POA: Diagnosis not present

## 2018-04-12 LAB — HEPATIC FUNCTION PANEL
ALBUMIN: 4.1 g/dL (ref 3.5–5.2)
ALK PHOS: 79 U/L (ref 39–117)
ALT: 14 U/L (ref 0–53)
AST: 16 U/L (ref 0–37)
Bilirubin, Direct: 0.1 mg/dL (ref 0.0–0.3)
TOTAL PROTEIN: 7 g/dL (ref 6.0–8.3)
Total Bilirubin: 0.6 mg/dL (ref 0.2–1.2)

## 2018-04-12 LAB — LDL CHOLESTEROL, DIRECT: LDL DIRECT: 83 mg/dL

## 2018-04-24 ENCOUNTER — Other Ambulatory Visit: Payer: Self-pay | Admitting: Family Medicine

## 2018-04-30 ENCOUNTER — Telehealth: Payer: Self-pay | Admitting: Family Medicine

## 2018-04-30 ENCOUNTER — Other Ambulatory Visit: Payer: Self-pay

## 2018-04-30 MED ORDER — LISINOPRIL-HYDROCHLOROTHIAZIDE 20-25 MG PO TABS: 1.0000 | ORAL_TABLET | Freq: Every day | ORAL | 2 refills | Status: DC

## 2018-04-30 NOTE — Telephone Encounter (Signed)
Copied from [place] [phone]. Topic: Quick Communication - Rx Refill/Question >> Apr 30, 2018 12:15 PM Scherrie Gerlach wrote: Medication: lisinopril-hydrochlorothiazide (PRINZIDE,ZESTORETIC) 20-25 MG tablet  90 days w/ refills  pharmacy forgot to send this request.  [facility] [address] [place] [phone] (Phone) [phone] (Fax)

## 2018-04-30 NOTE — Telephone Encounter (Signed)
refilled 

## 2018-07-27 ENCOUNTER — Telehealth: Payer: Self-pay

## 2018-07-27 NOTE — Telephone Encounter (Signed)
Call pt regarding lung screening left message at 11:06.

## 2018-07-29 ENCOUNTER — Telehealth: Payer: Self-pay | Admitting: *Deleted

## 2018-07-29 NOTE — Telephone Encounter (Signed)
Patient called r/t LDCT screening follow up of lung due at this time.  Discussed importance of the scan with the patient and encouraged them to reconsider.  Patient voiced not wanting to have this done at this time.  Patient instructed to call Burgess Estelle RN at [phone] if they have further questions or if they changed their mind and would like to proceed with the screening.  Patient voiced understanding.

## 2018-09-11 ENCOUNTER — Ambulatory Visit (INDEPENDENT_AMBULATORY_CARE_PROVIDER_SITE_OTHER): Payer: PPO | Admitting: Family Medicine

## 2018-09-11 ENCOUNTER — Encounter: Payer: Self-pay | Admitting: Family Medicine

## 2018-09-11 VITALS — BP 126/70 | HR 80 | Temp 98.0°F | Ht 70.0 in | Wt 167.6 lb

## 2018-09-11 DIAGNOSIS — E782 Mixed hyperlipidemia: Secondary | ICD-10-CM

## 2018-09-11 DIAGNOSIS — R200 Anesthesia of skin: Secondary | ICD-10-CM

## 2018-09-11 DIAGNOSIS — I10 Essential (primary) hypertension: Secondary | ICD-10-CM

## 2018-09-11 DIAGNOSIS — R5383 Other fatigue: Secondary | ICD-10-CM | POA: Diagnosis not present

## 2018-09-11 DIAGNOSIS — Z86010 Personal history of colonic polyps: Secondary | ICD-10-CM

## 2018-09-11 DIAGNOSIS — Z720 Tobacco use: Secondary | ICD-10-CM

## 2018-09-11 DIAGNOSIS — E119 Type 2 diabetes mellitus without complications: Secondary | ICD-10-CM | POA: Diagnosis not present

## 2018-09-11 DIAGNOSIS — R202 Paresthesia of skin: Secondary | ICD-10-CM | POA: Diagnosis not present

## 2018-09-11 NOTE — Progress Notes (Signed)
[name], MD Phone: [phone]  Michael Roy is a 72 y.o. male who presents today for f/u.  CC: htn, hld, diabetes, fatigue  HYPERTENSION Disease Monitoring: Blood pressure range-not checking Chest pain- no      Dyspnea- no Medications: Compliance- taking lisinopril, HCTZ, amlodipine   Edema- no  DIABETES Disease Monitoring: Blood Sugar ranges-not checking Polyuria/phagia/dipsia- no      Optho- UTD Medications: Compliance- taking metformin some of the time Hypoglycemic symptoms- no  HYPERLIPIDEMIA Disease Monitoring: See symptoms for Hypertension Medications: Compliance- taking lipitor some of the time Right upper quadrant pain- no  Muscle aches- no  Fatigue: Patient notes for the last couple months he feels sluggish and fatigued at times.  He feels have sleepy.  He does snore.  No apnea.  She had an episode of vomiting 1 month ago when he got overheated though otherwise no vomiting.  No nausea.  No night sweats.  No abdominal pain.  Neuropathy: Patient does note some tingling in his bilateral hands.  He notes in the past they thought maybe it was from his neck.  Has been stable.  He declines evaluation for this.  He tried gabapentin though did not continue on this.   Social History   Tobacco Use  Smoking Status Current Every Day Smoker  . Packs/day: 1.00  . Types: Cigarettes  Smokeless Tobacco Never Used     ROS see history of present illness  Objective  Physical Exam Vitals:   09/11/18 1535  BP: 126/70  Pulse: 80  Temp: 98 F (36.7 C)  SpO2: 93%    BP Readings from Last 3 Encounters:  09/11/18 126/70  03/12/18 130/84  09/11/17 120/86   Wt Readings from Last 3 Encounters:  09/11/18 167 lb 9.6 oz (76 kg)  03/12/18 160 lb 3.2 oz (72.7 kg)  09/11/17 157 lb 12.8 oz (71.6 kg)    Physical Exam  Constitutional: No distress.  Cardiovascular: Normal rate, regular rhythm and normal heart sounds.  Pulmonary/Chest: Effort normal and breath  sounds normal.  Musculoskeletal: He exhibits no edema.  Neurological: He is alert.  5/5 strength in bilateral biceps, triceps, grip, quads, hamstrings, plantar and dorsiflexion, sensation to light touch intact in bilateral UE and LE, normal gait  Skin: Skin is warm and dry. He is not diaphoretic.     Assessment/Plan: Please see individual problem list.  Hypertension Well-controlled.  Continue current regimen.  Fatigue Vague symptoms.  We will check lab work.  If unremarkable could consider sleep study though his body habitus would not fit with sleep apnea.  History of colonic polyps Refer for colonoscopy.  Hyperlipidemia I encouraged the patient to take his Lipitor more frequently.  Numbness and tingling in both hands Chronic issue.  Neurologically intact today.  Offered referral for evaluation.  He declined and opted to monitor.  Tobacco abuse Not interested in quitting.  Diabetes type 2, controlled ([place]) Continue metformin.  Check A1c.   Health Maintenance: Referred for colonoscopy.  Orders Placed This Encounter  Procedures  . Comp Met (CMET)  . CBC w/Diff  . HgB A1c  . TSH  . Vitamin D (25 hydroxy)  . B12  . Ambulatory referral to Gastroenterology    Referral Priority:   Routine    Referral Type:   Consultation    Referral Reason:   Specialty Services Required    Number of Visits Requested:   1    No orders of the defined types were placed in this encounter.  [name], MD [place]

## 2018-09-11 NOTE — Assessment & Plan Note (Signed)
Not interested in quitting 

## 2018-09-11 NOTE — Assessment & Plan Note (Signed)
Vague symptoms.  We will check lab work.  If unremarkable could consider sleep study though his body habitus would not fit with sleep apnea.

## 2018-09-11 NOTE — Assessment & Plan Note (Signed)
Refer for colonoscopy 

## 2018-09-11 NOTE — Patient Instructions (Signed)
Nice to see you. We will check lab work today and contact you with the results. If your fatigue is not improving please let us know.

## 2018-09-11 NOTE — Assessment & Plan Note (Signed)
I encouraged the patient to take his Lipitor more frequently.

## 2018-09-11 NOTE — Assessment & Plan Note (Signed)
Well-controlled.  Continue current regimen. 

## 2018-09-11 NOTE — Assessment & Plan Note (Signed)
Continue metformin.  Check A1c. 

## 2018-09-11 NOTE — Assessment & Plan Note (Signed)
Chronic issue.  Neurologically intact today.  Offered referral for evaluation.  He declined and opted to monitor.

## 2018-09-12 LAB — CBC WITH DIFFERENTIAL/PLATELET
BASOS ABS: 0 10*3/uL (ref 0.0–0.1)
Basophils Relative: 0.5 % (ref 0.0–3.0)
Eosinophils Absolute: 0.1 10*3/uL (ref 0.0–0.7)
Eosinophils Relative: 1 % (ref 0.0–5.0)
HEMATOCRIT: 44.4 % (ref 39.0–52.0)
HEMOGLOBIN: 15 g/dL (ref 13.0–17.0)
LYMPHS PCT: 29.5 % (ref 12.0–46.0)
Lymphs Abs: 2.1 10*3/uL (ref 0.7–4.0)
MCHC: 33.8 g/dL (ref 30.0–36.0)
MCV: 95.9 fl (ref 78.0–100.0)
MONOS PCT: 5.8 % (ref 3.0–12.0)
Monocytes Absolute: 0.4 10*3/uL (ref 0.1–1.0)
Neutro Abs: 4.5 10*3/uL (ref 1.4–7.7)
Neutrophils Relative %: 63.2 % (ref 43.0–77.0)
Platelets: 240 10*3/uL (ref 150.0–400.0)
RBC: 4.63 Mil/uL (ref 4.22–5.81)
RDW: 13.8 % (ref 11.5–15.5)
WBC: 7.2 10*3/uL (ref 4.0–10.5)

## 2018-09-12 LAB — COMPREHENSIVE METABOLIC PANEL
ALK PHOS: 58 U/L (ref 39–117)
ALT: 23 U/L (ref 0–53)
AST: 20 U/L (ref 0–37)
Albumin: 4.4 g/dL (ref 3.5–5.2)
BILIRUBIN TOTAL: 0.5 mg/dL (ref 0.2–1.2)
BUN: 23 mg/dL (ref 6–23)
CO2: 27 meq/L (ref 19–32)
Calcium: 10 mg/dL (ref 8.4–10.5)
Chloride: 103 mEq/L (ref 96–112)
Creatinine, Ser: 1.26 mg/dL (ref 0.40–1.50)
GFR: 72.73 mL/min (ref >60.00)
Glucose, Bld: 81 mg/dL (ref 70–99)
Potassium: 4.6 mEq/L (ref 3.5–5.1)
Sodium: 137 mEq/L (ref 135–145)
TOTAL PROTEIN: 7.5 g/dL (ref 6.0–8.3)

## 2018-09-12 LAB — VITAMIN B12: Vitamin B-12: 377 pg/mL (ref 211–911)

## 2018-09-12 LAB — TSH: TSH: 1.52 u[IU]/mL (ref 0.35–4.50)

## 2018-09-12 LAB — HEMOGLOBIN A1C: Hgb A1c MFr Bld: 6.4 % (ref 4.6–6.5)

## 2018-09-12 LAB — VITAMIN D 25 HYDROXY (VIT D DEFICIENCY, FRACTURES): VITD: 31.36 ng/mL (ref 30.00–100.00)

## 2018-09-30 ENCOUNTER — Telehealth: Payer: Self-pay

## 2018-09-30 NOTE — Telephone Encounter (Signed)
Copied from [place] [id number]. Topic: Quick Communication - Office Called Patient (Clinic Use ONLY) >> Sep 30, 2018  9:08 AM Michael Roy wrote: Reason for CRM: pt calling back to speak with [name] about an apt

## 2018-10-02 ENCOUNTER — Ambulatory Visit (INDEPENDENT_AMBULATORY_CARE_PROVIDER_SITE_OTHER): Payer: PPO | Admitting: Family Medicine

## 2018-10-02 ENCOUNTER — Encounter: Payer: Self-pay | Admitting: Family Medicine

## 2018-10-02 DIAGNOSIS — M545 Low back pain, unspecified: Secondary | ICD-10-CM

## 2018-10-02 MED ORDER — BACLOFEN 10 MG PO TABS: 10.0000 mg | ORAL_TABLET | Freq: Three times a day (TID) | ORAL | 0 refills | Status: DC | PRN

## 2018-10-02 NOTE — Progress Notes (Signed)
  [name], MD Phone: [phone]  Michael Roy is a 73 y.o. male who presents today for f/u.  CC: low back pain  Low back pain: Patient notes symptoms started 2 weeks ago in his left low back.  Notes he turned around funny while sitting down and thinks that may have exacerbated it.  Notes its in his left low back and somewhat down into his left buttocks.  No specific injury.  No associated numbness, weakness, loss of bowel or bladder function, or saddle anesthesia.  No radiation.  He does report a episode of right inguinal area numbness that occurred in the distant past.  This has not recurred.  It only occurred briefly on one occasion.  He has been using heating pad and Aleve with some benefit.  They do note a mild puffy area on his left hip.  They note it has been getting smaller.  On presentation to the office today was noted that his prior depression screening on his PHQ 2 was a score of 2.  Asked today regarding this and he denies any depression.  Denies SI.  Social History   Tobacco Use  Smoking Status Current Every Day Smoker  . Packs/day: 1.00  . Types: Cigarettes  Smokeless Tobacco Never Used     ROS see history of present illness  Objective  Physical Exam Vitals:   10/02/18 1420  BP: 118/78  Pulse: 70  Temp: 98.2 F (36.8 C)  SpO2: 94%    BP Readings from Last 3 Encounters:  10/02/18 118/78  09/11/18 126/70  03/12/18 130/84   Wt Readings from Last 3 Encounters:  10/02/18 163 lb 9.6 oz (74.2 kg)  09/11/18 167 lb 9.6 oz (76 kg)  03/12/18 160 lb 3.2 oz (72.7 kg)    Physical Exam  Constitutional: No distress.  Cardiovascular: Normal rate, regular rhythm and normal heart sounds.  Pulmonary/Chest: Effort normal and breath sounds normal.  Musculoskeletal: He exhibits no edema.       Legs: No midline spine tenderness, no midline spine step-off, no muscular back tenderness  Neurological: He is alert.  5/5 strength bilateral quads, hamstrings,  plantar flexion, and dorsiflexion, sensation light touch intact bilateral lower extremities, absent patellar reflexes bilaterally  Skin: Skin is warm and dry. He is not diaphoretic.     Assessment/Plan: Please see individual problem list.  Low back pain Related to muscular strain.  Discussed exercises.  He will discontinue Aleve.  Trial of baclofen as a muscle relaxer.  Discussed drowsy precautions with this.  Given return precautions.  If not improving over the next 2 weeks he will call us and we will obtain an x-ray.  If he develops new symptoms he will need to be seen at that time. They will monitor the puffy area and if it does not continue to improve they will let us know.  On exam this feels like fatty tissue.  No orders of the defined types were placed in this encounter.   Meds ordered this encounter  Medications  . baclofen (LIORESAL) 10 MG tablet    Sig: Take 1 tablet (10 mg total) by mouth 3 (three) times daily as needed for muscle spasms.    Dispense:  20 each    Refill:  0     [name], MD [place]

## 2018-10-02 NOTE — Patient Instructions (Addendum)
Nice to see you. I suspect you have strained your back.  Please discontinue the anti-inflammatories.  We will have you try some exercises at home and a muscle relaxer.  If your symptoms are not getting better over the next couple of weeks please let us know so we can get an x-ray. If you develop severe pain, numbness, weakness, loss of bowel or bladder function, or numbness between your legs please seek medical attention immediately.   Back Exercises If you have pain in your back, do these exercises 2-3 times each day or as told by your doctor. When the pain goes away, do the exercises once each day, but repeat the steps more times for each exercise (do more repetitions). If you do not have pain in your back, do these exercises once each day or as told by your doctor. Exercises Single Knee to Chest  Do these steps 3-5 times in a row for each leg: 1. Lie on your back on a firm bed or the floor with your legs stretched out. 2. Bring one knee to your chest. 3. Hold your knee to your chest by grabbing your knee or thigh. 4. Pull on your knee until you feel a gentle stretch in your lower back. 5. Keep doing the stretch for 10-30 seconds. 6. Slowly let go of your leg and straighten it.  Pelvic Tilt  Do these steps 5-10 times in a row: 1. Lie on your back on a firm bed or the floor with your legs stretched out. 2. Bend your knees so they point up to the ceiling. Your feet should be flat on the floor. 3. Tighten your lower belly (abdomen) muscles to press your lower back against the floor. This will make your tailbone point up to the ceiling instead of pointing down to your feet or the floor. 4. Stay in this position for 5-10 seconds while you gently tighten your muscles and breathe evenly.  Cat-Cow  Do these steps until your lower back bends more easily: 1. Get on your hands and knees on a firm surface. Keep your hands under your shoulders, and keep your knees under your hips. You may put  padding under your knees. 2. Let your head hang down, and make your tailbone point down to the floor so your lower back is round like the back of a cat. 3. Stay in this position for 5 seconds. 4. Slowly lift your head and make your tailbone point up to the ceiling so your back hangs low (sags) like the back of a cow. 5. Stay in this position for 5 seconds.  Press-Ups  Do these steps 5-10 times in a row: 1. Lie on your belly (face-down) on the floor. 2. Place your hands near your head, about shoulder-width apart. 3. While you keep your back relaxed and keep your hips on the floor, slowly straighten your arms to raise the top half of your body and lift your shoulders. Do not use your back muscles. To make yourself more comfortable, you may change where you place your hands. 4. Stay in this position for 5 seconds. 5. Slowly return to lying flat on the floor.  Bridges  Do these steps 10 times in a row: 1. Lie on your back on a firm surface. 2. Bend your knees so they point up to the ceiling. Your feet should be flat on the floor. 3. Tighten your butt muscles and lift your butt off of the floor until your waist is almost as  high as your knees. If you do not feel the muscles working in your butt and the back of your thighs, slide your feet 1-2 inches farther away from your butt. 4. Stay in this position for 3-5 seconds. 5. Slowly lower your butt to the floor, and let your butt muscles relax.  If this exercise is too easy, try doing it with your arms crossed over your chest. Belly Crunches  Do these steps 5-10 times in a row: 1. Lie on your back on a firm bed or the floor with your legs stretched out. 2. Bend your knees so they point up to the ceiling. Your feet should be flat on the floor. 3. Cross your arms over your chest. 4. Tip your chin a little bit toward your chest but do not bend your neck. 5. Tighten your belly muscles and slowly raise your chest just enough to lift your shoulder  blades a tiny bit off of the floor. 6. Slowly lower your chest and your head to the floor.  Back Lifts Do these steps 5-10 times in a row: 1. Lie on your belly (face-down) with your arms at your sides, and rest your forehead on the floor. 2. Tighten the muscles in your legs and your butt. 3. Slowly lift your chest off of the floor while you keep your hips on the floor. Keep the back of your head in line with the curve in your back. Look at the floor while you do this. 4. Stay in this position for 3-5 seconds. 5. Slowly lower your chest and your face to the floor.  Contact a doctor if:  Your back pain gets a lot worse when you do an exercise.  Your back pain does not lessen 2 hours after you exercise. If you have any of these problems, stop doing the exercises. Do not do them again unless your doctor says it is okay. Get help right away if:  You have sudden, very bad back pain. If this happens, stop doing the exercises. Do not do them again unless your doctor says it is okay. This information is not intended to replace advice given to you by your health care provider. Make sure you discuss any questions you have with your health care provider. Document Released: 12/23/2010 Document Revised: 04/27/2016 Document Reviewed: 01/14/2015 Elsevier Interactive Patient Education  [employer].

## 2018-10-02 NOTE — Assessment & Plan Note (Signed)
Related to muscular strain.  Discussed exercises.  He will discontinue Aleve.  Trial of baclofen as a muscle relaxer.  Discussed drowsy precautions with this.  Given return precautions.  If not improving over the next 2 weeks he will call us and we will obtain an x-ray.  If he develops new symptoms he will need to be seen at that time.

## 2019-01-15 ENCOUNTER — Encounter: Payer: Self-pay | Admitting: Family Medicine

## 2019-01-18 ENCOUNTER — Telehealth: Payer: Self-pay | Admitting: Family Medicine

## 2019-01-18 NOTE — Telephone Encounter (Signed)
Please contact the patient and let him know GI has been trying to get in touch with him to schedule a colonoscopy.  He can contact them at the following number [phone].  Thanks.

## 2019-01-20 NOTE — Telephone Encounter (Signed)
Called pt and left a detailed VM. Advised pt to call back if they have any further questions or concerns.

## 2019-01-20 NOTE — Telephone Encounter (Signed)
Called pt and left detailed VM advised pt to call back if they have any further questions.

## 2019-02-12 ENCOUNTER — Other Ambulatory Visit: Payer: Self-pay | Admitting: Family Medicine

## 2019-02-12 MED ORDER — LISINOPRIL-HYDROCHLOROTHIAZIDE 20-25 MG PO TABS: 1.0000 | ORAL_TABLET | Freq: Every day | ORAL | 0 refills | Status: DC

## 2019-02-12 MED ORDER — AMLODIPINE BESYLATE 5 MG PO TABS: 5.0000 mg | ORAL_TABLET | Freq: Every day | ORAL | 0 refills | Status: DC

## 2019-02-12 NOTE — Telephone Encounter (Signed)
Requested Prescriptions  Pending Prescriptions Disp Refills  . amLODipine (NORVASC) 5 MG tablet 90 tablet 0    Sig: Take 1 tablet (5 mg total) by mouth daily.     Cardiovascular:  Calcium Channel Blockers Passed - 02/12/2019  1:25 PM      Passed - Last BP in normal range    BP Readings from Last 1 Encounters:  10/02/18 118/78         Passed - Valid encounter within last 6 months    Recent Outpatient Visits          4 months ago Acute left-sided low back pain without sciatica   [facility] [place], Angela Adam, MD   5 months ago Fatigue, unspecified type   [facility] Leone Haven, MD   11 months ago Controlled type 2 diabetes mellitus without complication, without long-term current use of insulin [facility])   [place] Leone Haven, MD   1 year ago Essential hypertension   [facility] Leone Haven, MD   1 year ago Hyperlipidemia, unspecified hyperlipidemia type   [facility], Angela Adam, MD      Future Appointments            In 1 month Caryl Bis, Angela Adam, MD [place], [place]   In 1 month O'Brien-Blaney, Denisa L, LPN [place], [place]         . lisinopril-hydrochlorothiazide (PRINZIDE,ZESTORETIC) 20-25 MG tablet 90 tablet 0    Sig: Take 1 tablet by mouth daily.     Cardiovascular:  ACEI + Diuretic Combos Passed - 02/12/2019  1:25 PM      Passed - Na in normal range and within 180 days    Sodium  Date Value Ref Range Status  09/11/2018 137 135 - 145 mEq/L Final         Passed - K in normal range and within 180 days    Potassium  Date Value Ref Range Status  09/11/2018 4.6 3.5 - 5.1 mEq/L Final         Passed - Cr in normal range and within 180 days    Creat  Date Value Ref Range Status  06/16/2016 1.39 (H) 0.70 - 1.25 mg/dL Final    Comment:      For patients > or = 71 years of age: The upper reference  limit for Creatinine is approximately 13% higher for people identified as African-American.      Creatinine, Ser  Date Value Ref Range Status  09/11/2018 1.26 0.40 - 1.50 mg/dL Final         Passed - Ca in normal range and within 180 days    Calcium  Date Value Ref Range Status  09/11/2018 10.0 8.4 - 10.5 mg/dL Final         Passed - Patient is not pregnant      Passed - Last BP in normal range    BP Readings from Last 1 Encounters:  10/02/18 118/78         Passed - Valid encounter within last 6 months    Recent Outpatient Visits          4 months ago Acute left-sided low back pain without sciatica   [facility] [place], Angela Adam, MD   5 months ago Fatigue, unspecified type   [facility] Leone Haven, MD   11 months ago Controlled  type 2 diabetes mellitus without complication, without long-term current use of insulin [facility])   [place], Angela Adam, MD   1 year ago Essential hypertension   [place], Angela Adam, MD   1 year ago Hyperlipidemia, unspecified hyperlipidemia type   [facility], Angela Adam, MD      Future Appointments            In 1 month Caryl Bis, Angela Adam, MD [place], [place]   In 1 month O'Brien-Blaney, Bryson Corona, LPN [place], [place]

## 2019-02-12 NOTE — Telephone Encounter (Signed)
Copied from [place] [id number]. Topic: Quick Communication - Rx Refill/Question >> Feb 12, 2019  1:15 PM Keene Breath wrote: Medication: lisinopril-hydrochlorothiazide (PRINZIDE,ZESTORETIC) 20-25 MG tablet / amLODipine (NORVASC) 5 MG tablet  Patient called to request a refill for the above medications  Preferred Pharmacy (with phone number or street name): [facility] [address] [place] [phone] (Phone) [phone] (Fax)

## 2019-03-07 ENCOUNTER — Telehealth: Payer: Self-pay

## 2019-03-07 NOTE — Telephone Encounter (Signed)
Copied from [place] [id number]. Topic: General - Other >> Mar 07, 2019  8:58 AM Antonieta Iba C wrote: Reason for CRM: pt called in requesting assistance with rescheduling his 2 apts, pt says that he would rather reschedule to come in office instead of virtual.  Please assist.

## 2019-03-11 NOTE — Telephone Encounter (Signed)
I called pt and left a vm for pt to call ofc. Thanks

## 2019-03-14 ENCOUNTER — Ambulatory Visit: Payer: Self-pay | Admitting: Family Medicine

## 2019-03-18 ENCOUNTER — Ambulatory Visit: Payer: Self-pay

## 2019-03-18 ENCOUNTER — Ambulatory Visit: Payer: Self-pay | Admitting: Family Medicine

## 2019-05-20 ENCOUNTER — Telehealth: Payer: Self-pay | Admitting: Family Medicine

## 2019-05-20 NOTE — Telephone Encounter (Signed)
Medication: amLODipine (NORVASC) 5 MG tablet [[id number]] + lisinopril-hydrochlorothiazide (PRINZIDE,ZESTORETIC) 20-25 MG tablet [[id number]]   Has the patient contacted their pharmacy? Yes  (Agent: If no, request that the patient contact the pharmacy for the refill.) (Agent: If yes, when and what did the pharmacy advise?)  Preferred Pharmacy (with phone number or street name): [facility] [address] [place] [address] Phone: [phone] Fax: [phone]    Agent: Please be advised that RX refills may take up to 3 business days. We ask that you follow-up with your pharmacy.

## 2019-05-22 MED ORDER — LISINOPRIL-HYDROCHLOROTHIAZIDE 20-25 MG PO TABS: 1.0000 | ORAL_TABLET | Freq: Every day | ORAL | 0 refills | Status: DC

## 2019-05-22 MED ORDER — AMLODIPINE BESYLATE 5 MG PO TABS: 5.0000 mg | ORAL_TABLET | Freq: Every day | ORAL | 0 refills | Status: DC

## 2019-05-22 NOTE — Telephone Encounter (Signed)
Requested prescriptions have been refilled.

## 2019-09-04 ENCOUNTER — Other Ambulatory Visit: Payer: Self-pay | Admitting: Family Medicine

## 2019-09-04 NOTE — Telephone Encounter (Signed)
Patient has not been seen in a year and has a upcoming appointment a 30 day supply with 0 refills was sent to the pharmacy until his visit.  Nina,cma

## 2019-09-11 ENCOUNTER — Other Ambulatory Visit: Payer: Self-pay

## 2019-09-16 ENCOUNTER — Encounter: Payer: Self-pay | Admitting: Family Medicine

## 2019-09-16 ENCOUNTER — Other Ambulatory Visit: Payer: Self-pay

## 2019-09-16 ENCOUNTER — Ambulatory Visit (INDEPENDENT_AMBULATORY_CARE_PROVIDER_SITE_OTHER): Payer: PPO | Admitting: Family Medicine

## 2019-09-16 VITALS — BP 120/78 | HR 87 | Temp 98.2°F | Ht 71.0 in | Wt 166.4 lb

## 2019-09-16 DIAGNOSIS — K219 Gastro-esophageal reflux disease without esophagitis: Secondary | ICD-10-CM | POA: Diagnosis not present

## 2019-09-16 DIAGNOSIS — I10 Essential (primary) hypertension: Secondary | ICD-10-CM

## 2019-09-16 DIAGNOSIS — R634 Abnormal weight loss: Secondary | ICD-10-CM

## 2019-09-16 DIAGNOSIS — R5383 Other fatigue: Secondary | ICD-10-CM

## 2019-09-16 DIAGNOSIS — R1310 Dysphagia, unspecified: Secondary | ICD-10-CM

## 2019-09-16 DIAGNOSIS — E782 Mixed hyperlipidemia: Secondary | ICD-10-CM | POA: Diagnosis not present

## 2019-09-16 DIAGNOSIS — E119 Type 2 diabetes mellitus without complications: Secondary | ICD-10-CM | POA: Diagnosis not present

## 2019-09-16 DIAGNOSIS — R933 Abnormal findings on diagnostic imaging of other parts of digestive tract: Secondary | ICD-10-CM | POA: Diagnosis not present

## 2019-09-16 MED ORDER — OMEPRAZOLE 20 MG PO CPDR: 20.0000 mg | DELAYED_RELEASE_CAPSULE | Freq: Every day | ORAL | 2 refills | Status: AC

## 2019-09-16 NOTE — Progress Notes (Signed)
[name], MD Phone: [phone]  Michael Roy is a 73 y.o. male who presents today for follow-up.  Fatigue: Patient notes this has been going on for quite some time.  Feels sluggish and run down several days a week.  No chest pain shortness of breath.  He does feel sleepy.  He gets about 5 hours of sleep nightly.  He naps during the day.  He will take a cat nap in the evening and then get up and then go to bed around 11.  No trouble falling asleep or waking up.  He does wake up well rested.  He does snore though no apneic episodes.  No depression or anxiety.  GERD: Patient notes he has had intermittent issues with GERD for few months.  Depends on the food he eats.  Notes it is hard to swallow at times.  Feels like phlegm in his throat.  Hypertension: Not checking blood pressures.  No chest pain or shortness of breath.  No edema.  He is taking lisinopril and amlodipine.  He stopped metformin and atorvastatin.  Social History   Tobacco Use  Smoking Status Current Every Day Smoker  . Packs/day: 1.00  . Types: Cigarettes  Smokeless Tobacco Never Used     ROS see history of present illness  Objective  Physical Exam Vitals:   09/16/19 1537  BP: 120/78  Pulse: 87  Temp: 98.2 F (36.8 C)  SpO2: 95%    BP Readings from Last 3 Encounters:  09/16/19 120/78  10/02/18 118/78  09/11/18 126/70   Wt Readings from Last 3 Encounters:  09/16/19 166 lb 6.4 oz (75.5 kg)  10/02/18 163 lb 9.6 oz (74.2 kg)  09/11/18 167 lb 9.6 oz (76 kg)    Physical Exam Constitutional:      General: He is not in acute distress.    Appearance: He is not diaphoretic.  HENT:     Mouth/Throat:     Mouth: Mucous membranes are moist.     Pharynx: Oropharynx is clear.  Cardiovascular:     Rate and Rhythm: Normal rate and regular rhythm.     Heart sounds: Normal heart sounds.  Pulmonary:     Effort: Pulmonary effort is normal.     Breath sounds: Normal breath sounds.  Abdominal:   General: Bowel sounds are normal. There is no distension.     Palpations: Abdomen is soft.     Tenderness: There is no abdominal tenderness. There is no guarding or rebound.  Musculoskeletal:     Right lower leg: No edema.     Left lower leg: No edema.  Skin:    General: Skin is warm and dry.  Neurological:     Mental Status: He is alert.  Psychiatric:        Mood and Affect: Mood normal.      Assessment/Plan: Please see individual problem list.  Hypertension Well-controlled today.  He will continue his current medication.  Check lab work.  GERD (gastroesophageal reflux disease) Symptoms likely related to GERD.  Suspect some dysphagia based on description.  Will start on omeprazole.  Refer to GI.  Discussed potential causes of dysphagia.  Diabetes type 2, controlled ([place]) Not on medication.  Check lab work.  Fatigue Check lab work to evaluate for cause.  Could consider sleep study to evaluate further if lab work is negative.  Hyperlipidemia Check lipid panel.   Health Maintenance: Patient declines flu vaccine.  Orders Placed This Encounter  Procedures  . Comp Met (CMET)  .  HgB A1c  . CBC w/Diff  . TSH  . B12  . Vitamin D (25 hydroxy)  . Lipid panel  . Ambulatory referral to Gastroenterology    Referral Priority:   Routine    Referral Type:   Consultation    Referral Reason:   Specialty Services Required    Number of Visits Requested:   1    Meds ordered this encounter  Medications  . omeprazole (PRILOSEC) 20 MG capsule    Sig: Take 1 capsule (20 mg total) by mouth daily before breakfast.    Dispense:  30 capsule    Refill:  2     [name], MD [place]

## 2019-09-16 NOTE — Patient Instructions (Signed)
Nice to see you. We will check lab work today and contact you with results. Please start on the omeprazole. We will have you see GI as well. If you get to the point where you cannot swallow things please be evaluated.

## 2019-09-16 NOTE — Assessment & Plan Note (Signed)
Not on medication.  Check lab work.

## 2019-09-16 NOTE — Assessment & Plan Note (Signed)
Check lab work to evaluate for cause.  Could consider sleep study to evaluate further if lab work is negative.

## 2019-09-16 NOTE — Assessment & Plan Note (Signed)
Well-controlled today.  He will continue his current medication.  Check lab work.

## 2019-09-16 NOTE — Assessment & Plan Note (Signed)
Symptoms likely related to GERD.  Suspect some dysphagia based on description.  Will start on omeprazole.  Refer to GI.  Discussed potential causes of dysphagia.

## 2019-09-16 NOTE — Assessment & Plan Note (Signed)
Check lipid panel  

## 2019-09-17 LAB — COMPREHENSIVE METABOLIC PANEL
ALT: 28 U/L (ref 0–53)
AST: 20 U/L (ref 0–37)
Albumin: 4.4 g/dL (ref 3.5–5.2)
Alkaline Phosphatase: 67 U/L (ref 39–117)
BUN: 29 mg/dL — ABNORMAL HIGH (ref 6–23)
CO2: 26 mEq/L (ref 19–32)
Calcium: 10 mg/dL (ref 8.4–10.5)
Chloride: 104 mEq/L (ref 96–112)
Creatinine, Ser: 1.35 mg/dL (ref 0.40–1.50)
GFR: 63.01 mL/min (ref >60.00)
Glucose, Bld: 96 mg/dL (ref 70–99)
Potassium: 4.6 mEq/L (ref 3.5–5.1)
Sodium: 139 mEq/L (ref 135–145)
Total Bilirubin: 0.4 mg/dL (ref 0.2–1.2)
Total Protein: 6.9 g/dL (ref 6.0–8.3)

## 2019-09-17 LAB — CBC WITH DIFFERENTIAL/PLATELET
Basophils Absolute: 0.1 10*3/uL (ref 0.0–0.1)
Basophils Relative: 0.8 % (ref 0.0–3.0)
Eosinophils Absolute: 0.1 10*3/uL (ref 0.0–0.7)
Eosinophils Relative: 0.8 % (ref 0.0–5.0)
HCT: 43.8 % (ref 39.0–52.0)
Hemoglobin: 14.7 g/dL (ref 13.0–17.0)
Lymphocytes Relative: 31.8 % (ref 12.0–46.0)
Lymphs Abs: 2.3 10*3/uL (ref 0.7–4.0)
MCHC: 33.7 g/dL (ref 30.0–36.0)
MCV: 97.4 fl (ref 78.0–100.0)
Monocytes Absolute: 0.4 10*3/uL (ref 0.1–1.0)
Monocytes Relative: 6 % (ref 3.0–12.0)
Neutro Abs: 4.3 10*3/uL (ref 1.4–7.7)
Neutrophils Relative %: 60.6 % (ref 43.0–77.0)
Platelets: 249 10*3/uL (ref 150.0–400.0)
RBC: 4.49 Mil/uL (ref 4.22–5.81)
RDW: 12.8 % (ref 11.5–15.5)
WBC: 7.2 10*3/uL (ref 4.0–10.5)

## 2019-09-17 LAB — VITAMIN B12: Vitamin B-12: 440 pg/mL (ref 211–911)

## 2019-09-17 LAB — HEMOGLOBIN A1C: Hgb A1c MFr Bld: 6.6 % — ABNORMAL HIGH (ref 4.6–6.5)

## 2019-09-17 LAB — VITAMIN D 25 HYDROXY (VIT D DEFICIENCY, FRACTURES): VITD: 28.32 ng/mL — ABNORMAL LOW (ref 30.00–100.00)

## 2019-09-17 LAB — LDL CHOLESTEROL, DIRECT: Direct LDL: 185 mg/dL

## 2019-09-17 LAB — LIPID PANEL
Cholesterol: 262 mg/dL — ABNORMAL HIGH (ref 0–200)
HDL: 40.3 mg/dL (ref >39.00)
NonHDL: 221.89
Total CHOL/HDL Ratio: 7
Triglycerides: 319 mg/dL — ABNORMAL HIGH (ref 0.0–149.0)
VLDL: 63.8 mg/dL — ABNORMAL HIGH (ref 0.0–40.0)

## 2019-09-17 LAB — TSH: TSH: 0.77 u[IU]/mL (ref 0.35–4.50)

## 2019-09-23 ENCOUNTER — Encounter: Payer: Self-pay | Admitting: *Deleted

## 2019-10-07 ENCOUNTER — Telehealth: Payer: Self-pay

## 2019-10-07 DIAGNOSIS — E782 Mixed hyperlipidemia: Secondary | ICD-10-CM

## 2019-10-07 NOTE — Telephone Encounter (Signed)
Pt. Called back, given lab results and instructions. States he wants to hold off on sleep study. States he will try Crestor - please send to his pharmacy. Also needs refills on Amlodipine and Zestoretic.

## 2019-10-08 MED ORDER — LISINOPRIL-HYDROCHLOROTHIAZIDE 20-25 MG PO TABS: 1.0000 | ORAL_TABLET | Freq: Every day | ORAL | 1 refills | Status: DC

## 2019-10-08 MED ORDER — ROSUVASTATIN CALCIUM 40 MG PO TABS: 40.0000 mg | ORAL_TABLET | Freq: Every day | ORAL | 3 refills | Status: AC

## 2019-10-08 MED ORDER — AMLODIPINE BESYLATE 5 MG PO TABS: 5.0000 mg | ORAL_TABLET | Freq: Every day | ORAL | 1 refills | Status: DC

## 2019-10-08 NOTE — Telephone Encounter (Signed)
Sent to pharmacy. He needs repeat labs in 6 weeks. Orders placed.

## 2019-10-08 NOTE — Addendum Note (Signed)
Addended by: Caryl Bis, ERIC G on: 10/08/2019 05:01 PM   Modules accepted: Orders

## 2019-10-09 NOTE — Telephone Encounter (Signed)
I called and left a voicemail informing the patient that the medication Crestor was sent to the pharamcy and he needs a lab appointment in 6 weeks from the start of taking the medication.  Nina,cma

## 2019-10-14 NOTE — Telephone Encounter (Signed)
lmtcb . Patient needs a lab  Appointment to check cholesterol in 6 weeks.  Ok f or pec to advise.  Nina,cma

## 2019-11-19 ENCOUNTER — Ambulatory Visit: Payer: PPO | Admitting: Family Medicine

## 2019-12-19 ENCOUNTER — Ambulatory Visit: Payer: PPO | Admitting: Family Medicine

## 2020-05-04 ENCOUNTER — Telehealth: Payer: Self-pay | Admitting: Family Medicine

## 2020-05-04 MED ORDER — AMLODIPINE BESYLATE 5 MG PO TABS: 5.0000 mg | ORAL_TABLET | Freq: Every day | ORAL | 1 refills | Status: AC

## 2020-05-04 MED ORDER — LISINOPRIL-HYDROCHLOROTHIAZIDE 20-25 MG PO TABS: 1.0000 | ORAL_TABLET | Freq: Every day | ORAL | 1 refills | Status: AC

## 2020-05-04 NOTE — Addendum Note (Signed)
Addended by: Elpidio Galea T on: 05/04/2020 12:58 PM   Modules accepted: Orders

## 2020-05-04 NOTE — Telephone Encounter (Signed)
Pt needs a refill on amLODipine (NORVASC) 5 MG tablet and lisinopril-hydrochlorothiazide (ZESTORETIC) 20-25 MG tablet sent to [facility]

## 2020-05-25 ENCOUNTER — Telehealth: Payer: Self-pay | Admitting: Family Medicine

## 2020-05-25 NOTE — Telephone Encounter (Signed)
Copied from [place] [id number]. Topic: Medicare AWV >> May 25, 2020 11:52 AM Cher Nakai R wrote: Reason for CRM: Left message for patient to call back and schedule Medicare Annual Wellness Visit (AWV) either virtually or audio only.  No hx of AWV; please schedule at anytime with Denisa O'Brien-Blaney at [facility]

## 2020-06-01 ENCOUNTER — Encounter (INDEPENDENT_AMBULATORY_CARE_PROVIDER_SITE_OTHER): Payer: Self-pay

## 2020-06-01 ENCOUNTER — Ambulatory Visit (INDEPENDENT_AMBULATORY_CARE_PROVIDER_SITE_OTHER): Payer: PPO

## 2020-06-01 VITALS — Ht 71.0 in | Wt 166.0 lb

## 2020-06-01 DIAGNOSIS — Z0000 Encounter for general adult medical examination without abnormal findings: Secondary | ICD-10-CM | POA: Diagnosis not present

## 2020-06-01 NOTE — Progress Notes (Signed)
I have reviewed the above note and agree.  Boruch Manuele, M.D.  

## 2020-06-01 NOTE — Progress Notes (Signed)
Subjective:   Edson Deridder is a 74 y.o. male who presents for an Initial Medicare Annual Wellness Visit.  Review of Systems    No ROS.  Medicare Wellness Virtual Visit.   Cardiac Risk Factors include: advanced age (>73men, >78 women);diabetes mellitus;hypertension;male gender     Objective:    Today's Vitals   06/01/20 1032  Weight: 166 lb (75.3 kg)  Height: 5\' 11"  (1.803 m)   Body mass index is 23.15 kg/m.  Advanced Directives 06/01/2020 08/27/2017 03/19/2017 09/18/2016 03/02/2015  Does Patient Have a Medical Advance Directive? No No No No No  Would patient like information on creating a medical advance directive? No - Patient declined - - - -    Current Medications (verified) Outpatient Encounter Medications as of 06/01/2020  Medication Sig   amLODipine (NORVASC) 5 MG tablet Take 1 tablet (5 mg total) by mouth daily.   BAYER CONTOUR NEXT TEST test strip USE AS DIRECTED   lisinopril-hydrochlorothiazide (ZESTORETIC) 20-25 MG tablet Take 1 tablet by mouth daily.   MICROLET LANCETS MISC USE 1 LANCET TWICE DAILY   omeprazole (PRILOSEC) 20 MG capsule Take 1 capsule (20 mg total) by mouth daily before breakfast.   rosuvastatin (CRESTOR) 40 MG tablet Take 1 tablet (40 mg total) by mouth daily.   Facility-Administered Encounter Medications as of 06/01/2020  Medication   0.9 %  sodium chloride infusion    Allergies (verified) Patient has no known allergies.   History: Past Medical History:  Diagnosis Date   Aortic atherosclerosis (HCC)    Arthritis    Cataract    just starting   Cervical spine fracture (HCC)    Diabetes mellitus    borderline   Diverticulosis    Dry cough    smokers cough per pt.    Eating disorder    Headache(784.0)    Heart murmur    Hematuria 08/19/2013   Hepatic steatosis    Hyperlipidemia    Hypertension    Personal history of tobacco use, presenting hazards to health 05/30/2016   Tubular adenoma of colon    Past  Surgical History:  Procedure Laterality Date   COLONOSCOPY  07-3012   POLYPECTOMY  07-2012   18 polyps removed    stitches Left    calf area as a child    Family History  Problem Relation Age of Onset   Hypertension Mother    Diabetes Mother    Heart disease Mother    Hypertension Father    Heart disease Sister    Stroke Sister    Colon cancer Sister    Alcohol abuse Brother    Cancer Brother    Sudden death Brother    Colon polyps Brother    Colon polyps Brother    Colon polyps Brother    Rectal cancer Neg Hx    Stomach cancer Neg Hx    Social History   Socioeconomic History   Marital status: Married    Spouse name: Not on file   Number of children: Not on file   Years of education: Not on file   Highest education level: Not on file  Occupational History   Not on file  Tobacco Use   Smoking status: Current Every Day Smoker    Packs/day: 1.00    Types: Cigarettes   Smokeless tobacco: Never Used   Tobacco comment: 1/3 pack daily  Substance and Sexual Activity   Alcohol use: Yes    Alcohol/week: 1.0 standard drink    Types:  1 Cans of beer per week    Comment: beer   Drug use: No   Sexual activity: Not on file  Other Topics Concern   Not on file  Social History Narrative   Lives in [place]. 3 children.      Work - [employer]   Diet - healthy   Exercise - fishing   Social Determinants of Radio broadcast assistant Strain: Low Risk    Difficulty of Paying Living Expenses: Not hard at all  Food Insecurity: No Food Insecurity   Worried About Charity fundraiser in the Last Year: Never true   Arboriculturist in the Last Year: Never true  Transportation Needs: No Transportation Needs   Lack of Transportation (Medical): No   Lack of Transportation (Non-Medical): No  Physical Activity:    Days of Exercise per Week:    Minutes of Exercise per Session:   Stress: No Stress Concern Present   Feeling of Stress : Not  at all  Social Connections: Unknown   Frequency of Communication with Friends and Family: Three times a week   Frequency of Social Gatherings with Friends and Family: Three times a week   Attends Religious Services: Not on file   Active Member of Clubs or Organizations: No   Attends Archivist Meetings: Not on file   Marital Status: Married    Tobacco Counseling Ready to quit: Not Answered Counseling given: Not Answered Comment: 1/3 pack daily   Clinical Intake:  Pre-visit preparation completed: Yes     Diabetes: Yes (Followed by pcp) How often do you need to have someone help you when you read instructions, pamphlets, or other written materials from your doctor or pharmacy?: 1 - Never Interpreter Needed?: No      Activities of Daily Living In your present state of health, do you have any difficulty performing the following activities: 06/01/2020  Hearing? N  Vision? N  Difficulty concentrating or making decisions? N  Walking or climbing stairs? N  Dressing or bathing? N  Doing errands, shopping? N  Preparing Food and eating ? N  Using the Toilet? N  In the past six months, have you accidently leaked urine? N  Do you have problems with loss of bowel control? N  Managing your Medications? N  Managing your Finances? N  Housekeeping or managing your Housekeeping? N  Some recent data might be hidden    Patient Care Team: Leone Haven, MD as PCP - General (Family Medicine)  Indicate any recent Medical Services you may have received from other than [facility] providers in the past year (date may be approximate).     Assessment:   This is a routine wellness examination for [place].  I connected with Derian today by telephone and verified that I am speaking with the correct person using two identifiers. Location patient: home Location provider: work Persons participating in the virtual visit: patient, Marine scientist.    I discussed the limitations, risks,  security and privacy concerns of performing an evaluation and management service by telephone and the availability of in person appointments. The patient expressed understanding and verbally consented to this telephonic visit.    Interactive audio and video telecommunications were attempted between this provider and patient, however failed, due to patient having technical difficulties OR patient did not have access to video capability.  We continued and completed visit with audio only.  Some vital signs may be absent or patient reported.   Hearing/Vision  screen  Hearing Screening   125Hz  250Hz  500Hz  1000Hz  2000Hz  3000Hz  4000Hz  6000Hz  8000Hz   Right ear:           Left ear:           Comments: Patient is able to hear conversational tones without difficulty.  No issues reported.  Vision Screening Comments: Followed by [facility] Wears corrective lenses Visual acuity not assessed, virtual visit.  They have seen their ophthalmologist in the last 12 months.     Dietary issues and exercise activities discussed: Current Exercise Habits: The patient does not participate in regular exercise at presentHealthy diet Good water intake  Goals      Patient Stated     I want to decrease pepsi intake and decrease smoking (pt-stated)      Depression Screen PHQ 2/9 Scores 06/01/2020 09/16/2019 10/02/2018 09/11/2018 09/11/2017 06/23/2016 06/16/2016  PHQ - 2 Score 0 0 0 2 0 0 0    Fall Risk Fall Risk  06/01/2020 09/16/2019 10/02/2018 09/11/2018 09/11/2017  Falls in the past year? 0 0 No No No  Number falls in past yr: 0 0 - - -  Follow up Falls evaluation completed Falls evaluation completed - - -    Handrails in use when climbing stairs? Yes  Home free of loose throw rugs in walkways, pet beds, electrical cords, etc? Yes  Adequate lighting in your home to reduce risk of falls? Yes   ASSISTIVE DEVICES UTILIZED TO PREVENT FALLS:  Life alert? No  Use of a cane, walker or w/c? No  Grab  bars in the bathroom? No  Shower chair or bench in shower? No  Elevated toilet seat or a handicapped toilet? No   TIMED UP AND GO:  Was the test performed? No .   Cognitive Function:  Patient is alert and oriented x3 Denies difficulty with memory, focusing and concentrating.    6CIT Screen 06/01/2020  What Year? 0 points  What month? 0 points  What time? 0 points  Months in reverse 0 points    Immunizations There is no immunization history on file for this patient. Patient declines all vaccines.   Health Maintenance Health Maintenance  Topic Date Due   FOOT EXAM  03/13/2019   OPHTHALMOLOGY EXAM  03/14/2019   HEMOGLOBIN A1C  03/16/2020   COVID-19 Vaccine (1) 06/17/2020 (Originally 07/12/1958)   COLONOSCOPY  06/01/2021 (Originally 03/18/2018)   TETANUS/TDAP  06/01/2021 (Originally [birth year]1967)   PNA vac Low Risk Adult (1 of 2 - PCV13) 06/01/2021 (Originally 07/12/2013)   INFLUENZA VACCINE  07/04/2020   Hepatitis C Screening  Completed    Health Maintenance Health Maintenance Due  Topic Date Due   FOOT EXAM  03/13/2019   OPHTHALMOLOGY EXAM  03/14/2019   HEMOGLOBIN A1C  03/16/2020   Foot exam- notes no changes. Wears compression socks daily. Due.   Colonoscopy- declined  Eye Exam- agrees to call and schedule. No retinopathy reported.  A1c Screening- 09/16/19 (6.6)  Dental Screening: Recommended annual dental exams for proper oral hygiene  Community Resource Referral / Chronic Care Management: CRR required this visit?  No  CCM required this visit?  No    Plan:   Keep all routine maintenance appointments.   Follow up 07/05/20 @ 11:00. Patient will come fasting.   I have personally reviewed and noted the following in the patients chart:    Medical and social history  Use of alcohol, tobacco or illicit drugs   Current medications and supplements  Functional ability  and status  Nutritional status  Physical activity  Advanced directives  List  of other physicians  Hospitalizations, surgeries, and ER visits in previous 12 months  Vitals  Screenings to include cognitive, depression, and falls  Referrals and appointments  In addition, I have reviewed and discussed with patient certain preventive protocols, quality metrics, and best practice recommendations. A written personalized care plan for preventive services as well as general preventive health recommendations were provided to patient via mail.     Varney Biles, LPN   [id number]

## 2020-06-01 NOTE — Patient Instructions (Addendum)
[name] , Thank you for taking time to come for your Medicare Wellness Visit. I appreciate your ongoing commitment to your health goals. Please review the following plan we discussed and let me know if I can assist you in the future.   These are the goals we discussed: Goals      Patient Stated   .  I want to decrease pepsi intake and decrease smoking (pt-stated)       This is a list of the screening recommended for you and due dates:  Health Maintenance  Topic Date Due  . Complete foot exam   03/13/2019  . Eye exam for diabetics  03/14/2019  . Hemoglobin A1C  03/16/2020  . COVID-19 Vaccine (1) 06/17/2020*  . Colon Cancer Screening  06/01/2021*  . Tetanus Vaccine  06/01/2021*  . Pneumonia vaccines (1 of 2 - PCV13) 06/01/2021*  . Flu Shot  07/04/2020  .  Hepatitis C: One time screening is recommended by Center for Disease Control  (CDC) for  adults born from [birth year] through 1963.   Completed  *Topic was postponed. The date shown is not the original due date.    Immunizations There is no immunization history on file for this patient.  Keep all routine maintenance appointments.   Follow up 07/05/20 @ 11:00. Patient will come fasting.   Advanced directives: mailed per patient request  Conditions/risks identified: none new  Follow up in one year for your annual wellness visit.   Preventive Care 72 Years and Older, Male Preventive care refers to lifestyle choices and visits with your health care provider that can promote health and wellness. What does preventive care include?  A yearly physical exam. This is also called an annual well check.  Dental exams once or twice a year.  Routine eye exams. Ask your health care provider how often you should have your eyes checked.  Personal lifestyle choices, including:  Daily care of your teeth and gums.  Regular physical activity.  Eating a healthy diet.  Avoiding tobacco and drug use.  Limiting alcohol  use.  Practicing safe sex.  Taking low doses of aspirin every day.  Taking vitamin and mineral supplements as recommended by your health care provider. What happens during an annual well check? The services and screenings done by your health care provider during your annual well check will depend on your age, overall health, lifestyle risk factors, and family history of disease. Counseling  Your health care provider may ask you questions about your:  Alcohol use.  Tobacco use.  Drug use.  Emotional well-being.  Home and relationship well-being.  Sexual activity.  Eating habits.  History of falls.  Memory and ability to understand (cognition).  Work and work Statistician. Screening  You may have the following tests or measurements:  Height, weight, and BMI.  Blood pressure.  Lipid and cholesterol levels. These may be checked every 5 years, or more frequently if you are over 58 years old.  Skin check.  Lung cancer screening. You may have this screening every year starting at age 23 if you have a 30-pack-year history of smoking and currently smoke or have quit within the past 15 years.  Fecal occult blood test (FOBT) of the stool. You may have this test every year starting at age 1.  Flexible sigmoidoscopy or colonoscopy. You may have a sigmoidoscopy every 5 years or a colonoscopy every 10 years starting at age 72.  Prostate cancer screening. Recommendations will vary depending on your family  history and other risks.  Hepatitis C blood test.  Hepatitis B blood test.  Sexually transmitted disease (STD) testing.  Diabetes screening. This is done by checking your blood sugar (glucose) after you have not eaten for a while (fasting). You may have this done every 1-3 years.  Abdominal aortic aneurysm (AAA) screening. You may need this if you are a current or former smoker.  Osteoporosis. You may be screened starting at age 5 if you are at high risk. Talk with your  health care provider about your test results, treatment options, and if necessary, the need for more tests. Vaccines  Your health care provider may recommend certain vaccines, such as:  Influenza vaccine. This is recommended every year.  Tetanus, diphtheria, and acellular pertussis (Tdap, Td) vaccine. You may need a Td booster every 10 years.  Zoster vaccine. You may need this after age 72.  Pneumococcal 13-valent conjugate (PCV13) vaccine. One dose is recommended after age 72.  Pneumococcal polysaccharide (PPSV23) vaccine. One dose is recommended after age 1. Talk to your health care provider about which screenings and vaccines you need and how often you need them. This information is not intended to replace advice given to you by your health care provider. Make sure you discuss any questions you have with your health care provider. Document Released: 12/17/2015 Document Revised: 08/09/2016 Document Reviewed: 09/21/2015 Elsevier Interactive Patient Education  2017 [place] Prevention in the Home Falls can cause injuries. They can happen to people of all ages. There are many things you can do to make your home safe and to help prevent falls. What can I do on the outside of my home?  Regularly fix the edges of walkways and driveways and fix any cracks.  Remove anything that might make you trip as you walk through a door, such as a raised step or threshold.  Trim any bushes or trees on the path to your home.  Use bright outdoor lighting.  Clear any walking paths of anything that might make someone trip, such as rocks or tools.  Regularly check to see if handrails are loose or broken. Make sure that both sides of any steps have handrails.  Any raised decks and porches should have guardrails on the edges.  Have any leaves, snow, or ice cleared regularly.  Use sand or salt on walking paths during winter.  Clean up any spills in your garage right away. This includes oil  or grease spills. What can I do in the bathroom?  Use night lights.  Install grab bars by the toilet and in the tub and shower. Do not use towel bars as grab bars.  Use non-skid mats or decals in the tub or shower.  If you need to sit down in the shower, use a plastic, non-slip stool.  Keep the floor dry. Clean up any water that spills on the floor as soon as it happens.  Remove soap buildup in the tub or shower regularly.  Attach bath mats securely with double-sided non-slip rug tape.  Do not have throw rugs and other things on the floor that can make you trip. What can I do in the bedroom?  Use night lights.  Make sure that you have a light by your bed that is easy to reach.  Do not use any sheets or blankets that are too big for your bed. They should not hang down onto the floor.  Have a firm chair that has side arms. You can use  this for support while you get dressed.  Do not have throw rugs and other things on the floor that can make you trip. What can I do in the kitchen?  Clean up any spills right away.  Avoid walking on wet floors.  Keep items that you use a lot in easy-to-reach places.  If you need to reach something above you, use a strong step stool that has a grab bar.  Keep electrical cords out of the way.  Do not use floor polish or wax that makes floors slippery. If you must use wax, use non-skid floor wax.  Do not have throw rugs and other things on the floor that can make you trip. What can I do with my stairs?  Do not leave any items on the stairs.  Make sure that there are handrails on both sides of the stairs and use them. Fix handrails that are broken or loose. Make sure that handrails are as long as the stairways.  Check any carpeting to make sure that it is firmly attached to the stairs. Fix any carpet that is loose or worn.  Avoid having throw rugs at the top or bottom of the stairs. If you do have throw rugs, attach them to the floor with  carpet tape.  Make sure that you have a light switch at the top of the stairs and the bottom of the stairs. If you do not have them, ask someone to add them for you. What else can I do to help prevent falls?  Wear shoes that:  Do not have high heels.  Have rubber bottoms.  Are comfortable and fit you well.  Are closed at the toe. Do not wear sandals.  If you use a stepladder:  Make sure that it is fully opened. Do not climb a closed stepladder.  Make sure that both sides of the stepladder are locked into place.  Ask someone to hold it for you, if possible.  Clearly mark and make sure that you can see:  Any grab bars or handrails.  First and last steps.  Where the edge of each step is.  Use tools that help you move around (mobility aids) if they are needed. These include:  Canes.  Walkers.  Scooters.  Crutches.  Turn on the lights when you go into a dark area. Replace any light bulbs as soon as they burn out.  Set up your furniture so you have a clear path. Avoid moving your furniture around.  If any of your floors are uneven, fix them.  If there are any pets around you, be aware of where they are.  Review your medicines with your doctor. Some medicines can make you feel dizzy. This can increase your chance of falling. Ask your doctor what other things that you can do to help prevent falls. This information is not intended to replace advice given to you by your health care provider. Make sure you discuss any questions you have with your health care provider. Document Released: 09/16/2009 Document Revised: 04/27/2016 Document Reviewed: 12/25/2014 Elsevier Interactive Patient Education  2017 [employer].

## 2020-07-05 ENCOUNTER — Ambulatory Visit: Payer: PPO | Admitting: Family Medicine

## 2020-08-30 DIAGNOSIS — H524 Presbyopia: Secondary | ICD-10-CM | POA: Diagnosis not present

## 2020-08-30 DIAGNOSIS — E119 Type 2 diabetes mellitus without complications: Secondary | ICD-10-CM | POA: Diagnosis not present

## 2020-08-30 DIAGNOSIS — H5203 Hypermetropia, bilateral: Secondary | ICD-10-CM | POA: Diagnosis not present

## 2020-08-30 DIAGNOSIS — H25013 Cortical age-related cataract, bilateral: Secondary | ICD-10-CM | POA: Diagnosis not present

## 2020-08-30 DIAGNOSIS — H2513 Age-related nuclear cataract, bilateral: Secondary | ICD-10-CM | POA: Diagnosis not present

## 2020-08-30 LAB — HM DIABETES EYE EXAM

## 2020-10-04 ENCOUNTER — Telehealth: Payer: Self-pay | Admitting: Family Medicine

## 2020-10-04 NOTE — Telephone Encounter (Signed)
This patient is overdue for follow-up.  Please get him scheduled for a follow-up visit with me.  Thanks.

## 2020-10-05 NOTE — Telephone Encounter (Signed)
Lvm for the patient to call to schedule a f/up visit with the provider.  Epimenio Schetter,cma

## 2020-10-11 NOTE — Telephone Encounter (Signed)
LVM informing the patient to call back to schedule a f/up visit.  Naama S[place]ton,cma

## 2021-06-02 ENCOUNTER — Ambulatory Visit: Payer: PPO

## 2021-07-01 ENCOUNTER — Telehealth: Payer: Self-pay | Admitting: Family Medicine

## 2021-07-01 NOTE — Telephone Encounter (Signed)
Left message for patient to call back and schedule Medicare Annual Wellness Visit (AWV) in office.   If not able to come in office, please offer to do virtually or by telephone.   Last AWV:06/01/2020  Please schedule at anytime with Nurse Health Advisor.

## 2021-10-12 DIAGNOSIS — H25013 Cortical age-related cataract, bilateral: Secondary | ICD-10-CM | POA: Diagnosis not present

## 2021-10-12 DIAGNOSIS — E119 Type 2 diabetes mellitus without complications: Secondary | ICD-10-CM | POA: Diagnosis not present

## 2021-10-12 DIAGNOSIS — H524 Presbyopia: Secondary | ICD-10-CM | POA: Diagnosis not present

## 2021-10-12 DIAGNOSIS — H5203 Hypermetropia, bilateral: Secondary | ICD-10-CM | POA: Diagnosis not present

## 2021-10-12 DIAGNOSIS — H2513 Age-related nuclear cataract, bilateral: Secondary | ICD-10-CM | POA: Diagnosis not present

## 2021-10-12 LAB — HM DIABETES EYE EXAM

## 2022-12-28 DIAGNOSIS — H259 Unspecified age-related cataract: Secondary | ICD-10-CM | POA: Diagnosis not present

## 2022-12-28 DIAGNOSIS — I499 Cardiac arrhythmia, unspecified: Secondary | ICD-10-CM | POA: Diagnosis not present

## 2022-12-28 DIAGNOSIS — F17210 Nicotine dependence, cigarettes, uncomplicated: Secondary | ICD-10-CM | POA: Diagnosis not present

## 2022-12-28 DIAGNOSIS — I10 Essential (primary) hypertension: Secondary | ICD-10-CM | POA: Diagnosis not present

## 2022-12-28 DIAGNOSIS — G629 Polyneuropathy, unspecified: Secondary | ICD-10-CM | POA: Diagnosis not present

## 2022-12-28 DIAGNOSIS — J449 Chronic obstructive pulmonary disease, unspecified: Secondary | ICD-10-CM | POA: Diagnosis not present

## 2022-12-28 LAB — HM DIABETES FOOT EXAM: HM Diabetic Foot Exam: NORMAL

## 2023-05-02 DIAGNOSIS — H5203 Hypermetropia, bilateral: Secondary | ICD-10-CM | POA: Diagnosis not present

## 2023-05-02 DIAGNOSIS — H2513 Age-related nuclear cataract, bilateral: Secondary | ICD-10-CM | POA: Diagnosis not present

## 2023-05-02 DIAGNOSIS — H25013 Cortical age-related cataract, bilateral: Secondary | ICD-10-CM | POA: Diagnosis not present

## 2023-05-02 DIAGNOSIS — H524 Presbyopia: Secondary | ICD-10-CM | POA: Diagnosis not present

## 2023-05-02 DIAGNOSIS — E119 Type 2 diabetes mellitus without complications: Secondary | ICD-10-CM | POA: Diagnosis not present

## 2023-05-02 LAB — HM DIABETES EYE EXAM

## 2023-07-25 ENCOUNTER — Ambulatory Visit: Payer: PPO | Admitting: Family Medicine

## 2024-03-04 ENCOUNTER — Telehealth: Payer: Self-pay | Admitting: Internal Medicine

## 2024-03-04 NOTE — Telephone Encounter (Signed)
 Inbound call from patient stating that he has received a letter stating necessary lab testing needs to be completed. Advised patient I do not see any recent letters being sent out. Patient unsure if this was an error. Patient requesting a call back. Please advise, thank you.

## 2024-03-04 NOTE — Telephone Encounter (Signed)
 Left message for Michael Roy that it does not appear we sent any letters or notes regarding labwork. Advised he should check with his PCP to be sure this request did not come from them.

## 2024-07-14 ENCOUNTER — Telehealth: Payer: Self-pay

## 2024-07-14 NOTE — Telephone Encounter (Signed)
 House call forms were received for pt under Michael Glance, NP name however, pt has not been seen since 2020 by Dr. Maribeth.    Can pt be scheduled a TOC appt? Or is pt going to be considered a New Pt?
